# Patient Record
Sex: Male | Born: 1965 | Race: White | Hispanic: No | Marital: Married | Smoking: Former smoker
Health system: Southern US, Community
[De-identification: ages and names within clinical notes are randomized; demographics above are authoritative.]

## PROBLEM LIST (undated history)

## (undated) DIAGNOSIS — C259 Malignant neoplasm of pancreas, unspecified: Secondary | ICD-10-CM

## (undated) DIAGNOSIS — C787 Secondary malignant neoplasm of liver and intrahepatic bile duct: Secondary | ICD-10-CM

## (undated) DIAGNOSIS — E782 Mixed hyperlipidemia: Secondary | ICD-10-CM

## (undated) DIAGNOSIS — C250 Malignant neoplasm of head of pancreas: Secondary | ICD-10-CM

## (undated) DIAGNOSIS — Z8616 Personal history of COVID-19: Secondary | ICD-10-CM

## (undated) DIAGNOSIS — K219 Gastro-esophageal reflux disease without esophagitis: Secondary | ICD-10-CM

## (undated) DIAGNOSIS — K2270 Barrett's esophagus without dysplasia: Secondary | ICD-10-CM

## (undated) DIAGNOSIS — M542 Cervicalgia: Secondary | ICD-10-CM

## (undated) DIAGNOSIS — I10 Essential (primary) hypertension: Secondary | ICD-10-CM

## (undated) HISTORY — DX: Mixed hyperlipidemia: E78.2

## (undated) HISTORY — PX: NASAL POLYP SURGERY: SHX186

## (undated) HISTORY — DX: Barrett's esophagus without dysplasia: K22.70

## (undated) HISTORY — DX: Gastro-esophageal reflux disease without esophagitis: K21.9

## (undated) HISTORY — DX: Malignant neoplasm of pancreas, unspecified: C25.9

## (undated) HISTORY — DX: Essential (primary) hypertension: I10

## (undated) HISTORY — DX: Malignant neoplasm of head of pancreas: C25.0

## (undated) HISTORY — DX: Malignant neoplasm of pancreas, unspecified: C78.7

## (undated) HISTORY — DX: Personal history of COVID-19: Z86.16

## (undated) HISTORY — DX: Cervicalgia: M54.2

---

## 1986-05-05 HISTORY — PX: REFRACTIVE SURGERY: SHX103

## 1999-12-26 ENCOUNTER — Inpatient Hospital Stay (HOSPITAL_COMMUNITY): Admission: EM | Admit: 1999-12-26 | Discharge: 1999-12-28 | Payer: Self-pay | Admitting: Cardiology

## 2001-08-04 ENCOUNTER — Encounter: Payer: Self-pay | Admitting: Otolaryngology

## 2001-08-04 ENCOUNTER — Encounter: Admission: RE | Admit: 2001-08-04 | Discharge: 2001-08-04 | Payer: Self-pay | Admitting: Otolaryngology

## 2001-09-10 ENCOUNTER — Encounter: Payer: Self-pay | Admitting: Otolaryngology

## 2001-09-10 ENCOUNTER — Encounter: Admission: RE | Admit: 2001-09-10 | Discharge: 2001-09-10 | Payer: Self-pay | Admitting: Otolaryngology

## 2001-12-08 ENCOUNTER — Ambulatory Visit (HOSPITAL_BASED_OUTPATIENT_CLINIC_OR_DEPARTMENT_OTHER): Admission: RE | Admit: 2001-12-08 | Discharge: 2001-12-08 | Payer: Self-pay | Admitting: Otolaryngology

## 2004-08-26 ENCOUNTER — Ambulatory Visit: Payer: Self-pay | Admitting: Family Medicine

## 2014-09-04 DIAGNOSIS — N289 Disorder of kidney and ureter, unspecified: Secondary | ICD-10-CM | POA: Insufficient documentation

## 2015-01-05 HISTORY — PX: LAPAROSCOPIC CHOLECYSTECTOMY: SUR755

## 2015-09-04 ENCOUNTER — Other Ambulatory Visit: Payer: Self-pay | Admitting: Gastroenterology

## 2015-09-04 DIAGNOSIS — D136 Benign neoplasm of pancreas: Secondary | ICD-10-CM

## 2015-09-17 ENCOUNTER — Inpatient Hospital Stay: Admission: RE | Admit: 2015-09-17 | Payer: Self-pay | Source: Ambulatory Visit

## 2015-09-22 ENCOUNTER — Ambulatory Visit
Admission: RE | Admit: 2015-09-22 | Discharge: 2015-09-22 | Disposition: A | Discharge status: Home / Self Care | Payer: Managed Care, Other (non HMO) | Source: Ambulatory Visit | Attending: Gastroenterology | Admitting: Gastroenterology

## 2015-09-22 DIAGNOSIS — D136 Benign neoplasm of pancreas: Secondary | ICD-10-CM

## 2015-09-22 MED ORDER — GADOBENATE DIMEGLUMINE 529 MG/ML IV SOLN
17.0000 mL | Freq: Once | INTRAVENOUS | Status: AC | PRN
  Administered 2015-09-22: 17 mL via INTRAVENOUS

## 2015-10-15 DIAGNOSIS — K8689 Other specified diseases of pancreas: Secondary | ICD-10-CM | POA: Insufficient documentation

## 2015-10-15 DIAGNOSIS — C787 Secondary malignant neoplasm of liver and intrahepatic bile duct: Secondary | ICD-10-CM | POA: Insufficient documentation

## 2015-10-15 DIAGNOSIS — C259 Malignant neoplasm of pancreas, unspecified: Secondary | ICD-10-CM | POA: Insufficient documentation

## 2015-11-02 DIAGNOSIS — I10 Essential (primary) hypertension: Secondary | ICD-10-CM | POA: Insufficient documentation

## 2015-11-02 DIAGNOSIS — G4733 Obstructive sleep apnea (adult) (pediatric): Secondary | ICD-10-CM | POA: Insufficient documentation

## 2015-11-09 HISTORY — PX: WHIPPLE PROCEDURE: SHX2667

## 2016-11-06 DIAGNOSIS — R600 Localized edema: Secondary | ICD-10-CM | POA: Diagnosis not present

## 2016-11-06 DIAGNOSIS — I10 Essential (primary) hypertension: Secondary | ICD-10-CM | POA: Diagnosis not present

## 2016-11-06 DIAGNOSIS — K219 Gastro-esophageal reflux disease without esophagitis: Secondary | ICD-10-CM | POA: Diagnosis not present

## 2016-11-21 DIAGNOSIS — D701 Agranulocytosis secondary to cancer chemotherapy: Secondary | ICD-10-CM | POA: Diagnosis not present

## 2016-11-21 DIAGNOSIS — C772 Secondary and unspecified malignant neoplasm of intra-abdominal lymph nodes: Secondary | ICD-10-CM | POA: Diagnosis not present

## 2016-11-21 DIAGNOSIS — Z08 Encounter for follow-up examination after completed treatment for malignant neoplasm: Secondary | ICD-10-CM | POA: Diagnosis not present

## 2016-11-21 DIAGNOSIS — T451X5D Adverse effect of antineoplastic and immunosuppressive drugs, subsequent encounter: Secondary | ICD-10-CM | POA: Diagnosis not present

## 2016-11-21 DIAGNOSIS — C250 Malignant neoplasm of head of pancreas: Secondary | ICD-10-CM | POA: Diagnosis not present

## 2016-12-01 DIAGNOSIS — R51 Headache: Secondary | ICD-10-CM | POA: Diagnosis not present

## 2016-12-01 DIAGNOSIS — B349 Viral infection, unspecified: Secondary | ICD-10-CM | POA: Diagnosis not present

## 2016-12-01 DIAGNOSIS — R509 Fever, unspecified: Secondary | ICD-10-CM | POA: Diagnosis not present

## 2016-12-22 DIAGNOSIS — C250 Malignant neoplasm of head of pancreas: Secondary | ICD-10-CM | POA: Diagnosis not present

## 2016-12-22 DIAGNOSIS — R1013 Epigastric pain: Secondary | ICD-10-CM | POA: Diagnosis not present

## 2016-12-24 ENCOUNTER — Encounter: Payer: Self-pay | Admitting: Radiation Oncology

## 2016-12-24 DIAGNOSIS — C250 Malignant neoplasm of head of pancreas: Secondary | ICD-10-CM | POA: Diagnosis not present

## 2017-01-01 DIAGNOSIS — Z8507 Personal history of malignant neoplasm of pancreas: Secondary | ICD-10-CM | POA: Diagnosis not present

## 2017-01-01 DIAGNOSIS — G4733 Obstructive sleep apnea (adult) (pediatric): Secondary | ICD-10-CM | POA: Diagnosis not present

## 2017-01-01 DIAGNOSIS — R1013 Epigastric pain: Secondary | ICD-10-CM | POA: Diagnosis not present

## 2017-01-01 DIAGNOSIS — Z9049 Acquired absence of other specified parts of digestive tract: Secondary | ICD-10-CM | POA: Diagnosis not present

## 2017-01-01 DIAGNOSIS — I10 Essential (primary) hypertension: Secondary | ICD-10-CM | POA: Diagnosis not present

## 2017-02-06 DIAGNOSIS — Z08 Encounter for follow-up examination after completed treatment for malignant neoplasm: Secondary | ICD-10-CM | POA: Diagnosis not present

## 2017-02-06 DIAGNOSIS — C250 Malignant neoplasm of head of pancreas: Secondary | ICD-10-CM | POA: Diagnosis not present

## 2017-02-06 DIAGNOSIS — D72819 Decreased white blood cell count, unspecified: Secondary | ICD-10-CM | POA: Diagnosis not present

## 2017-03-17 DIAGNOSIS — J0100 Acute maxillary sinusitis, unspecified: Secondary | ICD-10-CM | POA: Diagnosis not present

## 2017-06-05 DIAGNOSIS — K2100 Gastro-esophageal reflux disease with esophagitis, without bleeding: Secondary | ICD-10-CM | POA: Insufficient documentation

## 2017-06-05 DIAGNOSIS — K219 Gastro-esophageal reflux disease without esophagitis: Secondary | ICD-10-CM | POA: Insufficient documentation

## 2017-09-17 ENCOUNTER — Encounter: Payer: Self-pay | Admitting: Gastroenterology

## 2017-11-23 DIAGNOSIS — M62838 Other muscle spasm: Secondary | ICD-10-CM | POA: Diagnosis not present

## 2017-12-16 DIAGNOSIS — R509 Fever, unspecified: Secondary | ICD-10-CM | POA: Diagnosis not present

## 2017-12-16 DIAGNOSIS — R109 Unspecified abdominal pain: Secondary | ICD-10-CM | POA: Diagnosis not present

## 2017-12-16 DIAGNOSIS — R079 Chest pain, unspecified: Secondary | ICD-10-CM | POA: Diagnosis not present

## 2017-12-16 DIAGNOSIS — Z90411 Acquired partial absence of pancreas: Secondary | ICD-10-CM | POA: Diagnosis not present

## 2017-12-16 DIAGNOSIS — R1012 Left upper quadrant pain: Secondary | ICD-10-CM | POA: Diagnosis not present

## 2017-12-18 DIAGNOSIS — K5909 Other constipation: Secondary | ICD-10-CM | POA: Diagnosis not present

## 2017-12-18 DIAGNOSIS — R509 Fever, unspecified: Secondary | ICD-10-CM | POA: Diagnosis not present

## 2018-01-29 DIAGNOSIS — R7989 Other specified abnormal findings of blood chemistry: Secondary | ICD-10-CM | POA: Diagnosis not present

## 2018-01-29 DIAGNOSIS — C250 Malignant neoplasm of head of pancreas: Secondary | ICD-10-CM | POA: Diagnosis not present

## 2018-01-29 DIAGNOSIS — C772 Secondary and unspecified malignant neoplasm of intra-abdominal lymph nodes: Secondary | ICD-10-CM | POA: Diagnosis not present

## 2018-02-02 DIAGNOSIS — K2970 Gastritis, unspecified, without bleeding: Secondary | ICD-10-CM | POA: Diagnosis not present

## 2018-02-02 DIAGNOSIS — K228 Other specified diseases of esophagus: Secondary | ICD-10-CM | POA: Diagnosis not present

## 2018-02-02 DIAGNOSIS — Z13810 Encounter for screening for upper gastrointestinal disorder: Secondary | ICD-10-CM | POA: Diagnosis not present

## 2018-11-02 ENCOUNTER — Other Ambulatory Visit: Payer: Self-pay | Admitting: Physician Assistant

## 2018-11-02 DIAGNOSIS — M542 Cervicalgia: Secondary | ICD-10-CM

## 2018-11-27 ENCOUNTER — Other Ambulatory Visit: Payer: Managed Care, Other (non HMO)

## 2018-12-20 ENCOUNTER — Other Ambulatory Visit: Payer: Managed Care, Other (non HMO)

## 2019-01-03 DIAGNOSIS — M5030 Other cervical disc degeneration, unspecified cervical region: Secondary | ICD-10-CM | POA: Insufficient documentation

## 2019-04-26 ENCOUNTER — Encounter: Payer: Self-pay | Admitting: Gastroenterology

## 2019-05-02 DIAGNOSIS — R0789 Other chest pain: Secondary | ICD-10-CM | POA: Diagnosis not present

## 2019-05-02 DIAGNOSIS — I10 Essential (primary) hypertension: Secondary | ICD-10-CM | POA: Diagnosis not present

## 2019-05-02 DIAGNOSIS — D72819 Decreased white blood cell count, unspecified: Secondary | ICD-10-CM | POA: Diagnosis not present

## 2019-05-02 DIAGNOSIS — U071 COVID-19: Secondary | ICD-10-CM | POA: Diagnosis not present

## 2019-05-03 DIAGNOSIS — I10 Essential (primary) hypertension: Secondary | ICD-10-CM | POA: Diagnosis not present

## 2019-05-03 DIAGNOSIS — R0789 Other chest pain: Secondary | ICD-10-CM | POA: Diagnosis not present

## 2019-05-03 DIAGNOSIS — D72819 Decreased white blood cell count, unspecified: Secondary | ICD-10-CM | POA: Diagnosis not present

## 2019-05-03 DIAGNOSIS — U071 COVID-19: Secondary | ICD-10-CM | POA: Diagnosis not present

## 2019-07-09 ENCOUNTER — Other Ambulatory Visit: Payer: Self-pay | Admitting: Family Medicine

## 2019-08-19 ENCOUNTER — Other Ambulatory Visit: Payer: Self-pay | Admitting: Hematology and Oncology

## 2019-08-19 DIAGNOSIS — K7689 Other specified diseases of liver: Secondary | ICD-10-CM

## 2019-09-15 ENCOUNTER — Ambulatory Visit
Admission: RE | Admit: 2019-09-15 | Discharge: 2019-09-15 | Disposition: A | Discharge status: Home / Self Care | Payer: Managed Care, Other (non HMO) | Source: Ambulatory Visit | Attending: Hematology and Oncology | Admitting: Hematology and Oncology

## 2019-09-15 DIAGNOSIS — K7689 Other specified diseases of liver: Secondary | ICD-10-CM

## 2019-09-15 MED ORDER — GADOBENATE DIMEGLUMINE 529 MG/ML IV SOLN
20.0000 mL | Freq: Once | INTRAVENOUS | Status: AC | PRN
  Administered 2019-09-15: 20 mL via INTRAVENOUS

## 2019-10-07 DIAGNOSIS — K769 Liver disease, unspecified: Secondary | ICD-10-CM | POA: Insufficient documentation

## 2019-10-13 ENCOUNTER — Other Ambulatory Visit: Payer: Self-pay | Admitting: Family Medicine

## 2019-10-13 ENCOUNTER — Other Ambulatory Visit: Payer: Self-pay

## 2019-10-13 ENCOUNTER — Encounter: Payer: Self-pay | Admitting: Family Medicine

## 2019-10-13 ENCOUNTER — Ambulatory Visit: Payer: Managed Care, Other (non HMO) | Admitting: Family Medicine

## 2019-10-13 VITALS — BP 132/88 | HR 66 | Temp 97.4°F | Ht 70.0 in | Wt 223.0 lb

## 2019-10-13 DIAGNOSIS — C787 Secondary malignant neoplasm of liver and intrahepatic bile duct: Secondary | ICD-10-CM | POA: Insufficient documentation

## 2019-10-13 DIAGNOSIS — C259 Malignant neoplasm of pancreas, unspecified: Secondary | ICD-10-CM | POA: Diagnosis not present

## 2019-10-13 DIAGNOSIS — K5904 Chronic idiopathic constipation: Secondary | ICD-10-CM

## 2019-10-13 DIAGNOSIS — Z8616 Personal history of COVID-19: Secondary | ICD-10-CM

## 2019-10-13 DIAGNOSIS — K8689 Other specified diseases of pancreas: Secondary | ICD-10-CM

## 2019-10-13 DIAGNOSIS — K219 Gastro-esophageal reflux disease without esophagitis: Secondary | ICD-10-CM | POA: Diagnosis not present

## 2019-10-13 DIAGNOSIS — I10 Essential (primary) hypertension: Secondary | ICD-10-CM | POA: Insufficient documentation

## 2019-10-13 MED ORDER — TRAZODONE HCL 50 MG PO TABS: 25.0000 mg | ORAL_TABLET | Freq: Every evening | ORAL | 3 refills | Status: DC | PRN

## 2019-10-13 NOTE — Progress Notes (Signed)
Subjective:  [name], male    DOB: 18-Dec-1965  Age: 54 y.o. MRN: [id number]  Chief Complaint  Patient presents with  . Pancreatic Cancer    Metastasized to liver  . Anxiety    HPI Patient is a 54 year old white male with pancreatic cancer diagnosed approximately 4 years ago.  He underwent treatment at that time which included a Whipple procedure.  He is continue to have routine monitoring and recently was found to have liver metastases, lungs, around aorta.  This makes it stage IV pancreatic cancer.  He is planning on continuing recommended treatments by his oncologist which will likely be Immunotherapy. Marland Kitchen  He denies any abdominal pain, nausea, vomiting.  He has had significant weight loss since his diagnosis of cancer however he has seemed to stabilize.  He is currently on Creon taking 3 capsules with each meal 1 before and 2 after.  He takes 2 capsules with snacks.  Hypertension: Currently being treated with amlodipine 5 mg twice daily and Zestril 10 mg once daily.  His blood pressure is well controlled.  Patient has generalized anxiety disorder and takes clonazepam 1 mg as needed daily for anxiety.  He also has trazodone for insomnia.   Office Visit from 10/13/2019 in [place]  PHQ-9 Total Score 7     PHQ9 SCORE ONLY 10/13/2019  PHQ-9 Total Score 7   Patient has problems with chronic constipation is currently taking MiraLAX and Linzess 72 mcg daily.  GERD-reflux is well controlled with Nexium.  Current Outpatient Medications on File Prior to Visit  Medication Sig Dispense Refill  . linaclotide (LINZESS) 72 MCG capsule as needed.    Marland Kitchen amLODipine (NORVASC) 5 MG tablet Take 5 mg by mouth 2 (two) times daily.    Marland Kitchen CREON 36000-114000 units CPEP capsule TAKE 3 capsules with meals. 1 after starting/2 when finished eating. Take 2 capsules with snack after starting to eat.    . esomeprazole (NEXIUM) 40 MG capsule Take 40 mg by mouth daily.    Marland Kitchen guaifenesin  (HUMIBID E) 400 MG TABS tablet Take by mouth as needed.    . meloxicam (MOBIC) 7.5 MG tablet Take 7.5 mg by mouth 2 (two) times daily.    . Multiple Vitamin (MULTIVITAMIN) capsule Take 1 capsule by mouth daily.    . Omega-3 1000 MG CAPS Take by mouth.    . polyethylene glycol powder (GLYCOLAX/MIRALAX) 17 GM/SCOOP powder Take by mouth.     No current facility-administered medications on file prior to visit.   Past Medical History:  Diagnosis Date  . Barrett's esophagus   . Cervicalgia   . Essential hypertension   . GERD (gastroesophageal reflux disease)   . History of COVID-19    05/2019  . Mixed hyperlipidemia   . Pancreatic carcinoma metastatic to liver ([place])   . Primary malignant neoplasm of head of pancreas ([facility])     Family History  Problem Relation Age of Onset  . Cancer Mother        breast  . Hypertension Father   . Hyperlipidemia Father   . Cancer Father        prostate   Social History   Socioeconomic History  . Marital status: Married    Spouse name: Not on file  . Number of children: 2  . Years of education: Not on file  . Highest education level: Not on file  Occupational History  . Occupation: Retired    Comment: [employer]  Tobacco  Use  . Smoking status: Former Smoker    Types: Cigars    Quit date: 2005    Years since quitting: 16.4  . Smokeless tobacco: Never Used  Substance and Sexual Activity  . Alcohol use: Not Currently  . Drug use: Never  . Sexual activity: Not on file  Other Topics Concern  . Not on file  Social History Narrative  . Not on file   Social Determinants of Health   Financial Resource Strain:   . Difficulty of Paying Living Expenses:   Food Insecurity:   . Worried About Charity fundraiser in the Last Year:   . Arboriculturist in the Last Year:   Transportation Needs:   . Film/video editor (Medical):   Marland Kitchen Lack of Transportation (Non-Medical):   Physical Activity:   . Days of Exercise per Week:   . Minutes of  Exercise per Session:   Stress:   . Feeling of Stress :   Social Connections:   . Frequency of Communication with Friends and Family:   . Frequency of Social Gatherings with Friends and Family:   . Attends Religious Services:   . Active Member of Clubs or Organizations:   . Attends Archivist Meetings:   Marland Kitchen Marital Status:     Review of Systems  Constitutional: Positive for fatigue. Negative for chills and fever.  HENT: Negative for congestion, ear pain and sore throat.   Respiratory: Negative for cough and shortness of breath.   Cardiovascular: Negative for chest pain.  Gastrointestinal: Negative for abdominal pain, constipation, diarrhea, nausea and vomiting.       Occasional pressure over liver and epigastric tenderness.  Endocrine: Negative for polydipsia, polyphagia and polyuria.  Genitourinary: Negative for dysuria and frequency.  Musculoskeletal: Negative for arthralgias and myalgias.  Neurological: Negative for dizziness and headaches.  Psychiatric/Behavioral: Negative for dysphoric mood.       No dysphoria     Objective:  BP 132/88   Pulse 66   Temp (!) 97.4 F (36.3 C)   Ht 5\' 10"  (1.778 m)   Wt 223 lb (101.2 kg)   SpO2 98%   BMI 32.00 kg/m   BP/Weight 10/13/2019 7/67/3419  Systolic BP 379 -  Diastolic BP 88 -  Wt. (Lbs) 223 185  BMI 32 -    Physical Exam Vitals reviewed.  Constitutional:      Appearance: Normal appearance.  Neck:     Vascular: No carotid bruit.  Cardiovascular:     Rate and Rhythm: Normal rate and regular rhythm.     Pulses: Normal pulses.     Heart sounds: Normal heart sounds.  Pulmonary:     Effort: Pulmonary effort is normal.     Breath sounds: Normal breath sounds. No wheezing, rhonchi or rales.  Abdominal:     General: Bowel sounds are normal.     Palpations: Abdomen is soft.     Tenderness: There is no abdominal tenderness.  Neurological:     Mental Status: He is alert.  Psychiatric:        Mood and Affect:  Mood normal.        Behavior: Behavior normal.     Diabetic Foot Exam - Simple   No data filed       Lab Results  Component Value Date   CHOL 199 10/13/2019   TRIG 104 10/13/2019   HDL 54 10/13/2019   LDLCALC 126 (H) 10/13/2019      Assessment &  Plan:   1. Essential hypertension, benign Well controlled.  No changes to medicines.  Continue to work on eating a healthy diet and exercise.  Labs drawn today.  - Lipid panel  2. Personal history of covid-19 - SAR CoV2 Serology (COVID 19)AB(IGG)IA - information to get covid vaccine.   3. Pancreatic cancer metastasized to liver [facility]) Follow up with oncology to start immunotherapy.  4. GERD without esophagitis The current medical regimen is effective;  continue present plan and medications.  5. Chronic idiopathic constipation The current medical regimen is effective;  continue present plan and medications.  6. Pancreatic insufficiency  The current medical regimen is effective;  continue present plan and medications.  7. GAD The current medical regimen is effective;  continue present plan and medications.  Meds ordered this encounter  Medications  . traZODone (DESYREL) 50 MG tablet    Sig: Take 0.5-1 tablets (25-50 mg total) by mouth at bedtime as needed for sleep.    Dispense:  30 tablet    Refill:  3    Orders Placed This Encounter  Procedures  . SAR CoV2 Serology (COVID 19)AB(IGG)IA  . Lipid panel  . Cardiovascular Risk Assessment     Follow-up: Return in about 3 months (around 01/13/2020).  An After Visit Summary was printed and given to the patient.  Rochel Brome [facility] [phone]

## 2019-10-13 NOTE — Progress Notes (Signed)
Note deleted

## 2019-10-13 NOTE — Patient Instructions (Addendum)
COVID-19 Vaccine Information can be found at: [email or web address] For questions related to vaccine distribution or appointments, please email [email or web address] or call [phone].    

## 2019-10-14 ENCOUNTER — Other Ambulatory Visit: Payer: Self-pay | Admitting: Family Medicine

## 2019-10-14 LAB — LIPID PANEL
Chol/HDL Ratio: 3.7 ratio (ref 0.0–5.0)
Cholesterol, Total: 199 mg/dL (ref 100–199)
HDL: 54 mg/dL (ref >39)
LDL Chol Calc (NIH): 126 mg/dL — ABNORMAL HIGH (ref 0–99)
Triglycerides: 104 mg/dL (ref 0–149)
VLDL Cholesterol Cal: 19 mg/dL (ref 5–40)

## 2019-10-14 LAB — CARDIOVASCULAR RISK ASSESSMENT

## 2019-10-14 LAB — SAR COV2 SEROLOGY (COVID19)AB(IGG),IA: DiaSorin SARS-CoV-2 Ab, IgG: POSITIVE

## 2019-10-16 ENCOUNTER — Encounter: Payer: Self-pay | Admitting: Family Medicine

## 2019-10-16 DIAGNOSIS — K8689 Other specified diseases of pancreas: Secondary | ICD-10-CM | POA: Insufficient documentation

## 2019-10-16 DIAGNOSIS — K219 Gastro-esophageal reflux disease without esophagitis: Secondary | ICD-10-CM | POA: Insufficient documentation

## 2019-10-16 DIAGNOSIS — K5904 Chronic idiopathic constipation: Secondary | ICD-10-CM | POA: Insufficient documentation

## 2019-10-27 LAB — SPECIMEN STATUS REPORT

## 2019-10-27 LAB — SARS-COV-2 SEMI-QUANTITATIVE TOTAL ANTIBODY, SPIKE: SARS-CoV-2 Semi-Quant Total Ab: 552.9 U/mL (ref <0.8)

## 2019-11-21 ENCOUNTER — Other Ambulatory Visit: Payer: Self-pay | Admitting: Family Medicine

## 2020-01-17 ENCOUNTER — Ambulatory Visit: Payer: Managed Care, Other (non HMO) | Admitting: Family Medicine

## 2020-01-18 ENCOUNTER — Ambulatory Visit (INDEPENDENT_AMBULATORY_CARE_PROVIDER_SITE_OTHER): Payer: Managed Care, Other (non HMO) | Admitting: Family Medicine

## 2020-01-18 ENCOUNTER — Encounter: Payer: Self-pay | Admitting: Family Medicine

## 2020-01-18 ENCOUNTER — Other Ambulatory Visit: Payer: Self-pay

## 2020-01-18 VITALS — BP 124/82 | HR 73 | Temp 97.4°F | Ht 69.0 in | Wt 209.0 lb

## 2020-01-18 DIAGNOSIS — C787 Secondary malignant neoplasm of liver and intrahepatic bile duct: Secondary | ICD-10-CM | POA: Diagnosis not present

## 2020-01-18 DIAGNOSIS — M10472 Other secondary gout, left ankle and foot: Secondary | ICD-10-CM | POA: Diagnosis not present

## 2020-01-18 DIAGNOSIS — C259 Malignant neoplasm of pancreas, unspecified: Secondary | ICD-10-CM | POA: Diagnosis not present

## 2020-01-18 MED ORDER — ALLOPURINOL 100 MG PO TABS: 100.0000 mg | ORAL_TABLET | Freq: Every day | ORAL | 2 refills | Status: DC

## 2020-01-18 MED ORDER — COLCHICINE 0.6 MG PO TABS: 0.6000 mg | ORAL_TABLET | Freq: Every day | ORAL | 0 refills | Status: DC

## 2020-01-18 NOTE — Patient Instructions (Signed)
Colchicine 0.6 mg one twice a day x 1 week. Start on allopurinol 100 mg in one week (after flare)

## 2020-01-18 NOTE — Progress Notes (Signed)
j   Acute Office Visit  Subjective:    [name], male    DOB: 1965-09-17, 54 y.o.   MRN: [id number]  Chief Complaint  Patient presents with  . Toe Pain    HPI Patient is in today for pain in left big toe x 1 day. Redness and warm to the touch. Patient states that a hot shower made it feel better this a.m., has had gout in the past. Patient has had 4 chemo treatments in the last 2 months.   Stage 4 Pancreatic cancer: Being seen at [facility].  Status post Whipple.  Now getting chemotherapy.   Past Medical History:  Diagnosis Date  . Barrett's esophagus   . Cervicalgia   . Essential hypertension   . GERD (gastroesophageal reflux disease)   . History of COVID-19    05/2019  . Mixed hyperlipidemia   . Pancreatic carcinoma metastatic to liver ([place])   . Primary malignant neoplasm of head of pancreas [facility])     Past Surgical History:  Procedure Laterality Date  . LAPAROSCOPIC CHOLECYSTECTOMY  01/05/2015  . NASAL POLYP SURGERY    . REFRACTIVE SURGERY Bilateral 1988  . WHIPPLE PROCEDURE  11/09/2015   Pylorus sparring    Family History  Problem Relation Age of Onset  . Cancer Mother        breast  . Hypertension Father   . Hyperlipidemia Father   . Cancer Father        prostate    Social History   Socioeconomic History  . Marital status: Married    Spouse name: Not on file  . Number of children: 2  . Years of education: Not on file  . Highest education level: Not on file  Occupational History  . Occupation: Retired    Comment: [employer]  Tobacco Use  . Smoking status: Former Smoker    Types: Cigars    Quit date: 2005    Years since quitting: 16.7  . Smokeless tobacco: Never Used  Substance and Sexual Activity  . Alcohol use: Not Currently  . Drug use: Never  . Sexual activity: Not on file  Other Topics Concern  . Not on file  Social History Narrative  . Not on file   Social Determinants of Health   Financial Resource Strain:   .  Difficulty of Paying Living Expenses: Not on file  Food Insecurity:   . Worried About Charity fundraiser in the Last Year: Not on file  . Ran Out of Food in the Last Year: Not on file  Transportation Needs:   . Lack of Transportation (Medical): Not on file  . Lack of Transportation (Non-Medical): Not on file  Physical Activity:   . Days of Exercise per Week: Not on file  . Minutes of Exercise per Session: Not on file  Stress:   . Feeling of Stress : Not on file  Social Connections:   . Frequency of Communication with Friends and Family: Not on file  . Frequency of Social Gatherings with Friends and Family: Not on file  . Attends Religious Services: Not on file  . Active Member of Clubs or Organizations: Not on file  . Attends Archivist Meetings: Not on file  . Marital Status: Not on file  Intimate Partner Violence:   . Fear of Current or Ex-Partner: Not on file  . Emotionally Abused: Not on file  . Physically Abused: Not on file  . Sexually Abused: Not on file  Outpatient Medications Prior to Visit  Medication Sig Dispense Refill  . Cyanocobalamin (VITAMIN B-12 PO) Take by mouth.    . Pyridoxine HCl (VITAMIN B-6 PO) Take by mouth.    . Riboflavin (VITAMIN B-2 PO) Take by mouth.    . Thiamine HCl (VITAMIN B-1 PO) Take by mouth.    Marland Kitchen amLODipine (NORVASC) 5 MG tablet TAKE ONE TABLET BY MOUTH TWICE DAILY 180 tablet 1  . clonazePAM (KLONOPIN) 1 MG tablet TAKE ONE TABLET BY MOUTH EVERY DAY AT BEDTIME AS NEEDED 30 tablet 2  . CREON 36000-114000 units CPEP capsule TAKE 3 capsules with meals. 1 after starting/2 when finished eating. Take 2 capsules with snack after starting to eat.    . esomeprazole (NEXIUM) 40 MG capsule Take 40 mg by mouth daily.    Marland Kitchen guaifenesin (HUMIBID E) 400 MG TABS tablet Take by mouth as needed.    . linaclotide (LINZESS) 72 MCG capsule as needed.    Marland Kitchen lisinopril (ZESTRIL) 10 MG tablet Take 1 tablet by mouth once daily 90 tablet 1  . LORazepam  (ATIVAN) 0.5 MG tablet Take 0.5 mg by mouth every 6 (six) hours as needed.    . meloxicam (MOBIC) 7.5 MG tablet Take 7.5 mg by mouth 2 (two) times daily.    . Multiple Vitamin (MULTIVITAMIN) capsule Take 1 capsule by mouth daily.    . Omega-3 1000 MG CAPS Take by mouth.    . polyethylene glycol powder (GLYCOLAX/MIRALAX) 17 GM/SCOOP powder Take by mouth.    . traZODone (DESYREL) 50 MG tablet Take 0.5-1 tablets (25-50 mg total) by mouth at bedtime as needed for sleep. 30 tablet 3   No facility-administered medications prior to visit.    No Known Allergies  Review of Systems  Constitutional: Positive for fatigue and unexpected weight change. Negative for chills, diaphoresis and fever.  HENT: Positive for ear pain. Negative for congestion and sore throat.   Eyes: Positive for visual disturbance.  Respiratory: Negative for cough and shortness of breath.   Cardiovascular: Negative for chest pain and leg swelling.  Gastrointestinal: Positive for diarrhea, nausea and vomiting. Negative for abdominal pain and constipation.  Endocrine: Positive for polydipsia. Negative for polyphagia.  Genitourinary: Negative for dysuria and urgency.  Musculoskeletal: Positive for arthralgias and back pain. Negative for myalgias.  Neurological: Positive for headaches. Negative for dizziness.  Psychiatric/Behavioral: Negative for dysphoric mood.       Objective:    Physical Exam Vitals reviewed.  Constitutional:      Appearance: Normal appearance.  Musculoskeletal:        General: Tenderness (left great toe. Warmth. Redness.) present.  Neurological:     Mental Status: He is alert.     BP 124/82   Pulse 73   Temp (!) 97.4 F (36.3 C)   Ht 5' 9"  (1.753 m)   Wt 209 lb (94.8 kg)   SpO2 94%   BMI 30.86 kg/m  Wt Readings from Last 3 Encounters:  01/18/20 209 lb (94.8 kg)  10/13/19 223 lb (101.2 kg)  09/22/15 185 lb (83.9 kg)    Health Maintenance Due  Topic Date Due  . Hepatitis C Screening   Never done  . COVID-19 Vaccine (1) Never done  . HIV Screening  Never done  . TETANUS/TDAP  Never done  . COLONOSCOPY  Never done  . INFLUENZA VACCINE  Never done    There are no preventive care reminders to display for this patient.   No results found for: TSH  No results found for: WBC, HGB, HCT, MCV, PLT No results found for: NA, K, CHLORIDE, CO2, GLUCOSE, BUN, CREATININE, BILITOT, ALKPHOS, AST, ALT, PROT, ALBUMIN, CALCIUM, ANIONGAP, EGFR, GFR Lab Results  Component Value Date   CHOL 199 10/13/2019   Lab Results  Component Value Date   HDL 54 10/13/2019   Lab Results  Component Value Date   LDLCALC 126 (H) 10/13/2019   Lab Results  Component Value Date   TRIG 104 10/13/2019   Lab Results  Component Value Date   CHOLHDL 3.7 10/13/2019   No results found for: HGBA1C     Assessment & Plan:  1. Acute gout due to other secondary cause involving toe of left foot Discussed with patient that with Chemo treatments he is more susceptible to gout. - colchicine 0.6 MG tablet; Take 1 tablet (0.6 mg total) by mouth daily.  Dispense: 14 tablet; Refill: 0 - allopurinol (ZYLOPRIM) 100 MG tablet; Take 1 tablet (100 mg total) by mouth daily.  Dispense: 30 tablet; Refill: 2   2. Pancreatic cancer metastasized to liver [facility]) Continue mgmt by oncology.  Follow-up: No follow-ups on file.  An After Visit Summary was printed and given to the patient.  Rochel Brome [facility] [phone]

## 2020-01-22 ENCOUNTER — Encounter: Payer: Self-pay | Admitting: Family Medicine

## 2020-02-03 ENCOUNTER — Other Ambulatory Visit: Payer: Self-pay

## 2020-02-03 MED ORDER — CLONAZEPAM 1 MG PO TABS: 1.0000 mg | ORAL_TABLET | Freq: Every evening | ORAL | 2 refills | Status: DC | PRN

## 2020-02-14 ENCOUNTER — Encounter: Payer: Self-pay | Admitting: Family Medicine

## 2020-02-14 ENCOUNTER — Other Ambulatory Visit: Payer: Self-pay

## 2020-02-14 ENCOUNTER — Ambulatory Visit (INDEPENDENT_AMBULATORY_CARE_PROVIDER_SITE_OTHER): Payer: Managed Care, Other (non HMO) | Admitting: Family Medicine

## 2020-02-14 VITALS — BP 122/82 | HR 70 | Temp 97.4°F | Ht 71.0 in | Wt 212.0 lb

## 2020-02-14 DIAGNOSIS — M1A4720 Other secondary chronic gout, left ankle and foot, without tophus (tophi): Secondary | ICD-10-CM

## 2020-02-14 DIAGNOSIS — Z0001 Encounter for general adult medical examination with abnormal findings: Secondary | ICD-10-CM

## 2020-02-14 DIAGNOSIS — Z125 Encounter for screening for malignant neoplasm of prostate: Secondary | ICD-10-CM

## 2020-02-14 MED ORDER — ALLOPURINOL 300 MG PO TABS: 300.0000 mg | ORAL_TABLET | Freq: Every day | ORAL | 6 refills | Status: DC

## 2020-02-14 MED ORDER — COLCHICINE 0.6 MG PO TABS: 0.6000 mg | ORAL_TABLET | Freq: Every day | ORAL | 1 refills | Status: DC

## 2020-02-14 NOTE — Patient Instructions (Signed)
Gout: Start colchicine 0.6 once daily.  Increase allopurinol 300 mg once daily     Preventive Care 33-56 Years Old, Male Preventive care refers to lifestyle choices and visits with your health care provider that can promote health and wellness. This includes:  A yearly physical exam. This is also called an annual well check.  Regular dental and eye exams.  Immunizations.  Screening for certain conditions.  Healthy lifestyle choices, such as eating a healthy diet, getting regular exercise, not using drugs or products that contain nicotine and tobacco, and limiting alcohol use. What can I expect for my preventive care visit? Physical exam Your health care provider will check:  Height and weight. These may be used to calculate body mass index (BMI), which is a measurement that tells if you are at a healthy weight.  Heart rate and blood pressure.  Your skin for abnormal spots. Counseling Your health care provider may ask you questions about:  Alcohol, tobacco, and drug use.  Emotional well-being.  Home and relationship well-being.  Sexual activity.  Eating habits.  Work and work Statistician. What immunizations do I need?  Influenza (flu) vaccine  This is recommended every year. Tetanus, diphtheria, and pertussis (Tdap) vaccine  You may need a Td booster every 10 years. Varicella (chickenpox) vaccine  You may need this vaccine if you have not already been vaccinated. Zoster (shingles) vaccine  You may need this after age 17. Measles, mumps, and rubella (MMR) vaccine  You may need at least one dose of MMR if you were born in 1957 or later. You may also need a second dose. Pneumococcal conjugate (PCV13) vaccine  You may need this if you have certain conditions and were not previously vaccinated. Pneumococcal polysaccharide (PPSV23) vaccine  You may need one or two doses if you smoke cigarettes or if you have certain conditions. Meningococcal conjugate (MenACWY)  vaccine  You may need this if you have certain conditions. Hepatitis A vaccine  You may need this if you have certain conditions or if you travel or work in places where you may be exposed to hepatitis A. Hepatitis B vaccine  You may need this if you have certain conditions or if you travel or work in places where you may be exposed to hepatitis B. Haemophilus influenzae type b (Hib) vaccine  You may need this if you have certain risk factors. Human papillomavirus (HPV) vaccine  If recommended by your health care provider, you may need three doses over 6 months. You may receive vaccines as individual doses or as more than one vaccine together in one shot (combination vaccines). Talk with your health care provider about the risks and benefits of combination vaccines. What tests do I need? Blood tests  Lipid and cholesterol levels. These may be checked every 5 years, or more frequently if you are over 69 years old.  Hepatitis C test.  Hepatitis B test. Screening  Lung cancer screening. You may have this screening every year starting at age 15 if you have a 30-pack-year history of smoking and currently smoke or have quit within the past 15 years.  Prostate cancer screening. Recommendations will vary depending on your family history and other risks.  Colorectal cancer screening. All adults should have this screening starting at age 50 and continuing until age 54. Your health care provider may recommend screening at age 26 if you are at increased risk. You will have tests every 1-10 years, depending on your results and the type of screening test.  Diabetes screening. This is done by checking your blood sugar (glucose) after you have not eaten for a while (fasting). You may have this done every 1-3 years.  Sexually transmitted disease (STD) testing. Follow these instructions at home: Eating and drinking  Eat a diet that includes fresh fruits and vegetables, whole grains, lean protein,  and low-fat dairy products.  Take vitamin and mineral supplements as recommended by your health care provider.  Do not drink alcohol if your health care provider tells you not to drink.  If you drink alcohol: ? Limit how much you have to 0-2 drinks a day. ? Be aware of how much alcohol is in your drink. In the [place] one drink equals one 12 oz bottle of beer (355 mL), one 5 oz glass of wine (148 mL), or one 1 oz glass of hard liquor (44 mL). Lifestyle  Take daily care of your teeth and gums.  Stay active. Exercise for at least 30 minutes on 5 or more days each week.  Do not use any products that contain nicotine or tobacco, such as cigarettes, e-cigarettes, and chewing tobacco. If you need help quitting, ask your health care provider.  If you are sexually active, practice safe sex. Use a condom or other form of protection to prevent STIs (sexually transmitted infections).  Talk with your health care provider about taking a low-dose aspirin every day starting at age 71. What's next?  Go to your health care provider once a year for a well check visit.  Ask your health care provider how often you should have your eyes and teeth checked.  Stay up to date on all vaccines. This information is not intended to replace advice given to you by your health care provider. Make sure you discuss any questions you have with your health care provider. Document Revised: 04/15/2018 Document Reviewed: 04/15/2018 Elsevier Patient Education  2020 [employer].

## 2020-02-14 NOTE — Progress Notes (Signed)
Subjective:  [name], male    DOB: 03/19/1966  Age: 54 y.o. MRN: [id number]  Chief Complaint  Patient presents with  . Annual Exam    HPI  Well Adult Physical: Patient here for a comprehensive physical exam.The patient reports problems - gout Do you take any herbs or supplements that were not prescribed by a doctor? yes Are you taking calcium supplements? no Are you taking aspirin daily? no  Encounter for general adult medical examination without abnormal findings  Physical ("At Risk" items are starred): Patient's last physical exam was 1 year ago .  Smoking:Former smoker ;  Physical Activity: Exercises at least 3 times per week ;  Alcohol/Drug Use: Is a non-drinker ; No illicit drug use ;  Patient is not afflicted from Stress Incontinence and Urge Incontinence  Safety: reviewed ; Patient wears a seat belt, has smoke detectors, has carbon monoxide detectors, practices appropriate gun safety, and wears sunscreen with extended sun exposure. Dental Care: cleanings as needed, brushes and flosses daily. Ophthalmology/Optometry: Annual visit. Due for a visit however advised that he wait until he finishes his chemo treatments.  Hearing loss: none Vision impairments: wears glasses Last PSA: 1.9 on 02/09/2019     Office Visit from 10/13/2019 in [place]  PHQ-2 Total Score 2      Social History   Socioeconomic History  . Marital status: Married    Spouse name: Not on file  . Number of children: 2  . Years of education: Not on file  . Highest education level: Not on file  Occupational History  . Occupation: Retired    Comment: [employer]  Tobacco Use  . Smoking status: Former Smoker    Types: Cigars    Quit date: 2005    Years since quitting: 16.8  . Smokeless tobacco: Never Used  Substance and Sexual Activity  . Alcohol use: Not Currently  . Drug use: Never  . Sexual activity: Yes    Partners: Female  Other Topics Concern  . Not on file    Social History Narrative  . Not on file   Social Determinants of Health   Financial Resource Strain:   . Difficulty of Paying Living Expenses: Not on file  Food Insecurity:   . Worried About Charity fundraiser in the Last Year: Not on file  . Ran Out of Food in the Last Year: Not on file  Transportation Needs:   . Lack of Transportation (Medical): Not on file  . Lack of Transportation (Non-Medical): Not on file  Physical Activity:   . Days of Exercise per Week: Not on file  . Minutes of Exercise per Session: Not on file  Stress:   . Feeling of Stress : Not on file  Social Connections:   . Frequency of Communication with Friends and Family: Not on file  . Frequency of Social Gatherings with Friends and Family: Not on file  . Attends Religious Services: Not on file  . Active Member of Clubs or Organizations: Not on file  . Attends Archivist Meetings: Not on file  . Marital Status: Not on file   Past Medical History:  Diagnosis Date  . Barrett's esophagus   . Cervicalgia   . Essential hypertension   . GERD (gastroesophageal reflux disease)   . History of COVID-19    05/2019  . Mixed hyperlipidemia   . Pancreatic carcinoma metastatic to liver ([place])   . Primary malignant neoplasm of head of  pancreas [facility])    Past Surgical History:  Procedure Laterality Date  . LAPAROSCOPIC CHOLECYSTECTOMY  01/05/2015  . NASAL POLYP SURGERY    . REFRACTIVE SURGERY Bilateral 1988  . WHIPPLE PROCEDURE  11/09/2015   Pylorus sparring    Family History  Problem Relation Age of Onset  . Cancer Mother        breast  . Hypertension Father   . Hyperlipidemia Father   . Cancer Father        prostate  . Hypertension Sister    Social History   Socioeconomic History  . Marital status: Married    Spouse name: Not on file  . Number of children: 2  . Years of education: Not on file  . Highest education level: Not on file  Occupational History  . Occupation: Retired     Comment: [employer]  Tobacco Use  . Smoking status: Former Smoker    Types: Cigars    Quit date: 2005    Years since quitting: 16.8  . Smokeless tobacco: Never Used  Substance and Sexual Activity  . Alcohol use: Not Currently  . Drug use: Never  . Sexual activity: Yes    Partners: Female  Other Topics Concern  . Not on file  Social History Narrative  . Not on file   Social Determinants of Health   Financial Resource Strain:   . Difficulty of Paying Living Expenses: Not on file  Food Insecurity:   . Worried About Charity fundraiser in the Last Year: Not on file  . Ran Out of Food in the Last Year: Not on file  Transportation Needs:   . Lack of Transportation (Medical): Not on file  . Lack of Transportation (Non-Medical): Not on file  Physical Activity:   . Days of Exercise per Week: Not on file  . Minutes of Exercise per Session: Not on file  Stress:   . Feeling of Stress : Not on file  Social Connections:   . Frequency of Communication with Friends and Family: Not on file  . Frequency of Social Gatherings with Friends and Family: Not on file  . Attends Religious Services: Not on file  . Active Member of Clubs or Organizations: Not on file  . Attends Archivist Meetings: Not on file  . Marital Status: Not on file   Review of Systems  Constitutional: Positive for diaphoresis (with chemo) and fatigue (with chemo). Negative for chills and fever.  HENT: Negative for congestion, ear pain and sore throat.   Respiratory: Negative for cough and shortness of breath.   Cardiovascular: Negative for chest pain.  Gastrointestinal: Negative for abdominal pain, constipation, diarrhea, nausea and vomiting.  Endocrine: Negative for polydipsia, polyphagia and polyuria.  Genitourinary: Negative for dysuria and frequency.  Musculoskeletal: Negative for arthralgias and myalgias.  Neurological: Negative for dizziness and headaches.  Psychiatric/Behavioral: Negative for  dysphoric mood.       No dysphoria    Objective:  BP 122/82   Pulse 70   Temp (!) 97.4 F (36.3 C)   Ht 5\' 11"  (1.803 m)   Wt 212 lb (96.2 kg)   SpO2 99%   BMI 29.57 kg/m   BP/Weight 02/14/2020 01/18/2020 8/76/8115  Systolic BP 726 203 559  Diastolic BP 82 82 88  Wt. (Lbs) 212 209 223  BMI 29.57 30.86 32    Physical Exam Vitals reviewed.  Constitutional:      Appearance: Normal appearance. He is normal weight.  HENT:  Right Ear: Tympanic membrane, ear canal and external ear normal.     Left Ear: Tympanic membrane, ear canal and external ear normal.     Nose: Nose normal. No congestion or rhinorrhea.     Mouth/Throat:     Mouth: Mucous membranes are moist.     Pharynx: No oropharyngeal exudate or posterior oropharyngeal erythema.  Eyes:     Conjunctiva/sclera: Conjunctivae normal.  Neck:     Vascular: No carotid bruit.  Cardiovascular:     Rate and Rhythm: Normal rate and regular rhythm.     Pulses: Normal pulses.     Heart sounds: Normal heart sounds.  Pulmonary:     Effort: Pulmonary effort is normal. No respiratory distress.     Breath sounds: Normal breath sounds. No wheezing, rhonchi or rales.  Abdominal:     General: Bowel sounds are normal.     Palpations: Abdomen is soft.     Tenderness: There is no abdominal tenderness.  Musculoskeletal:     Comments: Foot mild warmth. Nontender.   Lymphadenopathy:     Cervical: No cervical adenopathy.  Skin:    Findings: No lesion or rash.  Neurological:     Mental Status: He is alert and oriented to person, place, and time.  Psychiatric:        Mood and Affect: Mood normal.        Behavior: Behavior normal.     Lab Results  Component Value Date   CHOL 199 10/13/2019   TRIG 104 10/13/2019   HDL 54 10/13/2019   LDLCALC 126 (H) 10/13/2019      Assessment & Plan:  1. Encounter for routine adult medical exam with abnormal findings Patient is doing remarkably well in light of having metastatic pancreatic  cancer and undergoing chemotherapy.  Patient is going to get labs at oncology tomorrow. - CBC with Differential/Platelet - Comprehensive metabolic panel - Lipid panel - TSH  2. Screening for prostate cancer - PSA  3. 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  Increase allopurinol 300 mg once daily  - Uric acid   Body mass index is 29.57 kg/m.   Defer recommendations for vaccines to oncology as I have discussed with him and he would like to discuss with oncology.   This is a list of the screening recommended for you and due dates:   Health Maintenance  Topic Date Due  .  Hepatitis C: One time screening is recommended by Center for Disease Control  (CDC) for  adults born from [birth year] through 1967.   Never done  . COVID-19 Vaccine (1) Never done  . HIV Screening  Never done  . Tetanus Vaccine  Never done  . Colon Cancer Screening  Never done  . Flu Shot  Never done     AN INDIVIDUALIZED CARE PLAN: was established or reinforced today.   SELF MANAGEMENT: The patient and I together assessed ways to personally work towards obtaining the recommended goals  Support needs The patient and/or family needs were assessed and services were offered if appropriate.  Meds ordered this encounter  Medications  . allopurinol (ZYLOPRIM) 300 MG tablet    Sig: Take 1 tablet (300 mg total) by mouth daily.    Dispense:  30 tablet    Refill:  6  . colchicine 0.6 MG tablet    Sig: Take 1 tablet (0.6 mg total) by mouth daily.    Dispense:  90 tablet    Refill:  1    Follow-up: Return in about 6 months (around 08/14/2020).  An After Visit Summary was printed and given to the patient.  Rochel Brome [facility] [phone]

## 2020-02-19 ENCOUNTER — Encounter: Payer: Self-pay | Admitting: Family Medicine

## 2020-03-27 ENCOUNTER — Other Ambulatory Visit: Payer: Self-pay

## 2020-03-27 MED ORDER — ESOMEPRAZOLE MAGNESIUM 40 MG PO CPDR: 40.0000 mg | DELAYED_RELEASE_CAPSULE | Freq: Every day | ORAL | 3 refills | Status: DC

## 2020-04-03 ENCOUNTER — Other Ambulatory Visit: Payer: Self-pay | Admitting: Family Medicine

## 2020-04-10 ENCOUNTER — Encounter: Payer: Self-pay | Admitting: Family Medicine

## 2020-04-10 ENCOUNTER — Other Ambulatory Visit: Payer: Self-pay

## 2020-04-10 ENCOUNTER — Ambulatory Visit (INDEPENDENT_AMBULATORY_CARE_PROVIDER_SITE_OTHER): Payer: Managed Care, Other (non HMO) | Admitting: Family Medicine

## 2020-04-10 VITALS — BP 138/96 | HR 88 | Temp 97.5°F | Resp 18 | Ht 71.0 in | Wt 203.6 lb

## 2020-04-10 DIAGNOSIS — I10 Essential (primary) hypertension: Secondary | ICD-10-CM | POA: Diagnosis not present

## 2020-04-10 MED ORDER — AMLODIPINE BESYLATE 10 MG PO TABS
10.0000 mg | ORAL_TABLET | Freq: Every day | ORAL | 1 refills | Status: DC
Start: 2020-04-10 — End: 2020-10-18

## 2020-04-10 MED ORDER — LISINOPRIL 20 MG PO TABS: 20.0000 mg | ORAL_TABLET | Freq: Every day | ORAL | 0 refills | Status: DC

## 2020-04-10 NOTE — Progress Notes (Signed)
Acute Office Visit  Subjective:    [name], male    DOB: 1966-05-04, 54 y.o.   MRN: [id number]  Chief Complaint  Patient presents with  . Hypertension    HPI Patient is in today for elevated for BP over the last 4 weeks. Gradually increasing. Currenlty on lisinorpil 10 mg once daily and amlodipine 10 mg once daily. Headache at times.  Past Medical History:  Diagnosis Date  . Barrett's esophagus   . Cervicalgia   . Essential hypertension   . GERD (gastroesophageal reflux disease)   . History of COVID-19    05/2019  . Mixed hyperlipidemia   . Pancreatic carcinoma metastatic to liver ([place])   . Primary malignant neoplasm of head of pancreas [facility])     Past Surgical History:  Procedure Laterality Date  . LAPAROSCOPIC CHOLECYSTECTOMY  01/05/2015  . NASAL POLYP SURGERY    . REFRACTIVE SURGERY Bilateral 1988  . WHIPPLE PROCEDURE  11/09/2015   Pylorus sparring    Family History  Problem Relation Age of Onset  . Cancer Mother        breast  . Hypertension Father   . Hyperlipidemia Father   . Cancer Father        prostate  . Hypertension Sister     Social History   Socioeconomic History  . Marital status: Married    Spouse name: Not on file  . Number of children: 2  . Years of education: Not on file  . Highest education level: Not on file  Occupational History  . Occupation: Retired    Comment: [employer]  Tobacco Use  . Smoking status: Former Smoker    Types: Cigars    Quit date: 2005    Years since quitting: 16.9  . Smokeless tobacco: Never Used  Substance and Sexual Activity  . Alcohol use: Not Currently  . Drug use: Never  . Sexual activity: Yes    Partners: Female  Other Topics Concern  . Not on file  Social History Narrative  . Not on file   Social Determinants of Health   Financial Resource Strain:   . Difficulty of Paying Living Expenses: Not on file  Food Insecurity:   . Worried About Charity fundraiser in the Last  Year: Not on file  . Ran Out of Food in the Last Year: Not on file  Transportation Needs:   . Lack of Transportation (Medical): Not on file  . Lack of Transportation (Non-Medical): Not on file  Physical Activity:   . Days of Exercise per Week: Not on file  . Minutes of Exercise per Session: Not on file  Stress:   . Feeling of Stress : Not on file  Social Connections:   . Frequency of Communication with Friends and Family: Not on file  . Frequency of Social Gatherings with Friends and Family: Not on file  . Attends Religious Services: Not on file  . Active Member of Clubs or Organizations: Not on file  . Attends Archivist Meetings: Not on file  . Marital Status: Not on file  Intimate Partner Violence:   . Fear of Current or Ex-Partner: Not on file  . Emotionally Abused: Not on file  . Physically Abused: Not on file  . Sexually Abused: Not on file    Outpatient Medications Prior to Visit  Medication Sig Dispense Refill  . acetaminophen (TYLENOL) 500 MG tablet Take 500 mg by mouth every 6 (six) hours as needed.    Marland Kitchen  allopurinol (ZYLOPRIM) 300 MG tablet Take 1 tablet (300 mg total) by mouth daily. 30 tablet 6  . colchicine 0.6 MG tablet Take 1 tablet (0.6 mg total) by mouth daily. 90 tablet 1  . CREON 36000-114000 units CPEP capsule TAKE 3 capsules with meals. 1 after starting/2 when finished eating. Take 2 capsules with snack after starting to eat.    . Cyanocobalamin (VITAMIN B-12 PO) Take by mouth.    . esomeprazole (NEXIUM) 40 MG capsule Take 1 capsule (40 mg total) by mouth daily. 90 capsule 3  . gabapentin (NEURONTIN) 300 MG capsule Take by mouth.    . guaifenesin (HUMIBID E) 400 MG TABS tablet Take by mouth as needed.    Marland Kitchen LORazepam (ATIVAN) 0.5 MG tablet Take 0.5 mg by mouth every 6 (six) hours as needed.    . meloxicam (MOBIC) 7.5 MG tablet TAKE ONE TABLET BY MOUTH TWICE DAILY 60 tablet 11  . Multiple Vitamin (MULTIVITAMIN) capsule Take 1 capsule by mouth daily.      . Omega-3 1000 MG CAPS Take by mouth.    . Pyridoxine HCl (VITAMIN B-6 PO) Take by mouth.    . Riboflavin (VITAMIN B-2 PO) Take by mouth.    . Thiamine HCl (VITAMIN B-1 PO) Take by mouth.    . traMADol (ULTRAM) 50 MG tablet Take by mouth.    Marland Kitchen amLODipine (NORVASC) 5 MG tablet TAKE ONE TABLET BY MOUTH TWICE DAILY (Patient taking differently: Take 10 mg by mouth daily. ) 180 tablet 1  . lisinopril (ZESTRIL) 10 MG tablet Take 1 tablet by mouth once daily 90 tablet 1  . linaclotide (LINZESS) 72 MCG capsule as needed. (Patient not taking: Reported on 04/10/2020)    . polyethylene glycol powder (GLYCOLAX/MIRALAX) 17 GM/SCOOP powder Take by mouth. (Patient not taking: Reported on 04/10/2020)     No facility-administered medications prior to visit.    Allergies  Allergen Reactions  . Trazodone And Nefazodone     Somnolence. Cloudy feeling.     Review of Systems  Constitutional: Positive for appetite change and fatigue. Negative for chills and fever.  HENT: Positive for sinus pain. Negative for congestion, rhinorrhea and sore throat.   Respiratory: Negative for cough and shortness of breath.   Cardiovascular: Positive for chest pain (Referred pain from [facility]. Taking tramadol and gabapentin. ). Negative for palpitations.  Gastrointestinal: Positive for abdominal pain (cramping from diarrhea. ), diarrhea (with chemo) and nausea (with chemo). Negative for constipation and vomiting.  Genitourinary: Positive for frequency. Negative for dysuria and urgency.  Musculoskeletal: Negative for arthralgias, back pain and myalgias.  Neurological: Positive for headaches. Negative for dizziness.  Psychiatric/Behavioral: Negative for dysphoric mood. The patient is nervous/anxious.        Objective:    Physical Exam Vitals reviewed.  Constitutional:      Appearance: Normal appearance.  Neck:     Vascular: No carotid bruit.  Cardiovascular:     Rate and Rhythm: Normal rate and regular rhythm.      Pulses: Normal pulses.     Heart sounds: Normal heart sounds.  Pulmonary:     Effort: Pulmonary effort is normal.     Breath sounds: Normal breath sounds. No wheezing, rhonchi or rales.  Neurological:     Mental Status: He is alert and oriented to person, place, and time.  Psychiatric:        Mood and Affect: Mood normal.        Behavior: Behavior normal.    BP Marland Kitchen)  138/96   Pulse 88   Temp (!) 97.5 F (36.4 C)   Resp 18   Ht _0  (1.803 m)   Wt 203 lb 9.6 oz (92.4 kg)   BMI 28.40 kg/m  Wt Readings from Last 3 Encounters:  04/10/20 203 lb 9.6 oz (92.4 kg)  02/14/20 212 lb (96.2 kg)  01/18/20 209 lb (94.8 kg)    Health Maintenance Due  Topic Date Due  . Hepatitis C Screening  Never done  . COVID-19 Vaccine (1) Never done  . HIV Screening  Never done  . TETANUS/TDAP  Never done  . COLONOSCOPY  Never done  . INFLUENZA VACCINE  Never done    There are no preventive care reminders to display for this patient.   No results found for: TSH No results found for: WBC, HGB, HCT, MCV, PLT No results found for: NA, K, CHLORIDE, CO2, GLUCOSE, BUN, CREATININE, BILITOT, ALKPHOS, AST, ALT, PROT, ALBUMIN, CALCIUM, ANIONGAP, EGFR, GFR Lab Results  Component Value Date   CHOL 199 10/13/2019   Lab Results  Component Value Date   HDL 54 10/13/2019   Lab Results  Component Value Date   LDLCALC 126 (H) 10/13/2019   Lab Results  Component Value Date   TRIG 104 10/13/2019   Lab Results  Component Value Date   CHOLHDL 3.7 10/13/2019   No results found for: HGBA1C     Assessment & Plan:  Confused congested ed Increase Zestril 20 mg once daily.  Continue amlodipine 10 mg once daily. Patient to monitor blood pressure at oncology appointments.  Also chemistry panel should be rechecked in approximately 2 weeks.  He is getting these on a regular basis at oncology.  Patient to call back if blood pressure continues to be elevated or if kidney function worsens upon the higher dose  of medicine.  Meds ordered this encounter  Medications  . lisinopril (ZESTRIL) 20 MG tablet    Sig: Take 1 tablet (20 mg total) by mouth daily.    Dispense:  90 tablet    Refill:  0  . amLODipine (NORVASC) 10 MG tablet    Sig: Take 1 tablet (10 mg total) by mouth daily.    Dispense:  90 tablet    Refill:  1    No orders of the defined types were placed in this encounter.   Follow-up:  Prn  An After Visit Summary was printed and given to the patient.  Rochel Brome, MD [facility] [phone]

## 2020-04-10 NOTE — Patient Instructions (Signed)
Relion brand generic at [facility].   Recommend increase lisinopril to 20 mg once daily.  Continue amlodipine to 10 mg once daily.

## 2020-04-26 ENCOUNTER — Other Ambulatory Visit: Payer: Self-pay | Admitting: Family Medicine

## 2020-05-08 ENCOUNTER — Other Ambulatory Visit: Payer: Self-pay | Admitting: Physician Assistant

## 2020-05-08 ENCOUNTER — Telehealth: Payer: Self-pay

## 2020-05-08 MED ORDER — KETOCONAZOLE 2 % EX CREA: 1.0000 "application " | TOPICAL_CREAM | Freq: Two times a day (BID) | CUTANEOUS | 0 refills | Status: DC

## 2020-05-08 NOTE — Telephone Encounter (Signed)
Daniel Neal called to report that he completed antibiotics for an internal infection and is now experiencing a yeast infection.  He is complaining of a penile rash which he has used OTC yeast medication with no relief.

## 2020-07-30 ENCOUNTER — Telehealth (INDEPENDENT_AMBULATORY_CARE_PROVIDER_SITE_OTHER): Payer: Managed Care, Other (non HMO) | Admitting: Nurse Practitioner

## 2020-07-30 ENCOUNTER — Encounter: Payer: Self-pay | Admitting: Nurse Practitioner

## 2020-07-30 VITALS — Ht 71.0 in | Wt 203.0 lb

## 2020-07-30 DIAGNOSIS — J0181 Other acute recurrent sinusitis: Secondary | ICD-10-CM | POA: Diagnosis not present

## 2020-07-30 MED ORDER — AMOXICILLIN-POT CLAVULANATE 875-125 MG PO TABS: 1.0000 | ORAL_TABLET | Freq: Two times a day (BID) | ORAL | 0 refills | Status: DC

## 2020-07-30 NOTE — Progress Notes (Deleted)
Acute Office Visit  Subjective:    [name], male    DOB: 1965/10/31, 55 y.o.   MRN: [id number]  No chief complaint on file.   HPI Patient is in today for ***  Past Medical History:  Diagnosis Date  . Barrett's esophagus   . Cervicalgia   . Essential hypertension   . GERD (gastroesophageal reflux disease)   . History of COVID-19    05/2019  . Mixed hyperlipidemia   . Pancreatic carcinoma metastatic to liver ([place])   . Primary malignant neoplasm of head of pancreas [facility])     Past Surgical History:  Procedure Laterality Date  . LAPAROSCOPIC CHOLECYSTECTOMY  01/05/2015  . NASAL POLYP SURGERY    . REFRACTIVE SURGERY Bilateral 1988  . WHIPPLE PROCEDURE  11/09/2015   Pylorus sparring    Family History  Problem Relation Age of Onset  . Cancer Mother        breast  . Hypertension Father   . Hyperlipidemia Father   . Cancer Father        prostate  . Hypertension Sister     Social History   Socioeconomic History  . Marital status: Married    Spouse name: Not on file  . Number of children: 2  . Years of education: Not on file  . Highest education level: Not on file  Occupational History  . Occupation: Retired    Comment: [employer]  Tobacco Use  . Smoking status: Former Smoker    Types: Cigars    Quit date: 2005    Years since quitting: 17.2  . Smokeless tobacco: Never Used  Substance and Sexual Activity  . Alcohol use: Not Currently  . Drug use: Never  . Sexual activity: Yes    Partners: Female  Other Topics Concern  . Not on file  Social History Narrative  . Not on file   Social Determinants of Health   Financial Resource Strain: Not on file  Food Insecurity: Not on file  Transportation Needs: Not on file  Physical Activity: Not on file  Stress: Not on file  Social Connections: Not on file  Intimate Partner Violence: Not on file    Outpatient Medications Prior to Visit  Medication Sig Dispense Refill  . acetaminophen  (TYLENOL) 500 MG tablet Take 500 mg by mouth every 6 (six) hours as needed.    Marland Kitchen allopurinol (ZYLOPRIM) 300 MG tablet Take 1 tablet (300 mg total) by mouth daily. 30 tablet 6  . amLODipine (NORVASC) 10 MG tablet Take 1 tablet (10 mg total) by mouth daily. 90 tablet 1  . colchicine 0.6 MG tablet Take 1 tablet (0.6 mg total) by mouth daily. 90 tablet 1  . CREON 36000-114000 units CPEP capsule TAKE 3 capsules with meals. 1 after starting/2 when finished eating. Take 2 capsules with snack after starting to eat.    . Cyanocobalamin (VITAMIN B-12 PO) Take by mouth.    . esomeprazole (NEXIUM) 40 MG capsule Take 1 capsule (40 mg total) by mouth daily. 90 capsule 3  . gabapentin (NEURONTIN) 300 MG capsule Take by mouth.    . guaifenesin (HUMIBID E) 400 MG TABS tablet Take by mouth as needed.    Marland Kitchen ketoconazole (NIZORAL) 2 % cream Apply 1 application topically 2 (two) times daily. 15 g 0  . linaclotide (LINZESS) 72 MCG capsule as needed. (Patient not taking: Reported on 04/10/2020)    . lisinopril (ZESTRIL) 10 MG tablet Take 1 tablet by mouth once  daily 90 tablet 1  . lisinopril (ZESTRIL) 20 MG tablet Take 1 tablet (20 mg total) by mouth daily. 90 tablet 0  . LORazepam (ATIVAN) 0.5 MG tablet Take 0.5 mg by mouth every 6 (six) hours as needed.    . meloxicam (MOBIC) 7.5 MG tablet TAKE ONE TABLET BY MOUTH TWICE DAILY 60 tablet 11  . Multiple Vitamin (MULTIVITAMIN) capsule Take 1 capsule by mouth daily.    . Omega-3 1000 MG CAPS Take by mouth.    . polyethylene glycol powder (GLYCOLAX/MIRALAX) 17 GM/SCOOP powder Take by mouth. (Patient not taking: Reported on 04/10/2020)    . Pyridoxine HCl (VITAMIN B-6 PO) Take by mouth.    . Riboflavin (VITAMIN B-2 PO) Take by mouth.    . Thiamine HCl (VITAMIN B-1 PO) Take by mouth.    . traMADol (ULTRAM) 50 MG tablet Take by mouth.     No facility-administered medications prior to visit.    Allergies  Allergen Reactions  . Trazodone And Nefazodone     Somnolence.  Cloudy feeling.     Review of Systems     Objective:    Physical Exam  There were no vitals taken for this visit. Wt Readings from Last 3 Encounters:  04/10/20 203 lb 9.6 oz (92.4 kg)  02/14/20 212 lb (96.2 kg)  01/18/20 209 lb (94.8 kg)    Health Maintenance Due  Topic Date Due  . Hepatitis C Screening  Never done  . COVID-19 Vaccine (1) Never done  . HIV Screening  Never done  . TETANUS/TDAP  Never done  . COLONOSCOPY (Pts 45-55yr Insurance coverage will need to be confirmed)  Never done  . INFLUENZA VACCINE  Never done    There are no preventive care reminders to display for this patient.   No results found for: TSH No results found for: WBC, HGB, HCT, MCV, PLT No results found for: NA, K, CHLORIDE, CO2, GLUCOSE, BUN, CREATININE, BILITOT, ALKPHOS, AST, ALT, PROT, ALBUMIN, CALCIUM, ANIONGAP, EGFR, GFR Lab Results  Component Value Date   CHOL 199 10/13/2019   Lab Results  Component Value Date   HDL 54 10/13/2019   Lab Results  Component Value Date   LDLCALC 126 (H) 10/13/2019   Lab Results  Component Value Date   TRIG 104 10/13/2019   Lab Results  Component Value Date   CHOLHDL 3.7 10/13/2019   No results found for: HGBA1C     Assessment & Plan:   Problem List Items Addressed This Visit   None      No orders of the defined types were placed in this encounter.    SRip Harbour NP

## 2020-07-30 NOTE — Progress Notes (Signed)
Virtual Visit via Telephone Note   This visit type was conducted due to national recommendations for restrictions regarding the COVID-19 Pandemic (e.g. social distancing) in an effort to limit this patient's exposure and mitigate transmission in our community.  Due to his co-morbid illnesses, this patient is at least at moderate risk for complications without adequate follow up.  This format is felt to be most appropriate for this patient at this time.  The patient did not have access to video technology/had technical difficulties with video requiring transitioning to audio format only (telephone).  All issues noted in this document were discussed and addressed.  No physical exam could be performed with this format.  Patient verbally consented to a telehealth visit.   Date:  07/30/2020   ID:  Daniel Neal, DOB 03-24-1966, MRN [id number]  Patient Location: Home Provider Location: Office/Clinic  PCP:  Rochel Brome, MD   Evaluation Performed:  Established patient, acute telemedicine visit  Chief Complaint:  Sinus congestion  History of Present Illness:    Daniel Neal is a 55 y.o. male with nasal congestion, sinus pain/pressure, fever(101 F) , right eye and ear pain. Pt states he is also experiencing post-nasal-drip that is causing a non-productive cough. Onset of symptoms was two weeks ago. Treatment has included nasal saline Navage, Zyrtec, Benadryl, and Guaifenesin, and Ricola cough drops. He tells me that he has a history of nasal polyps. He is seen by ENT at [facility]. Pt has history of pancreatic cancer that has metastasized to liver. He is currently receiving chemotherapy at [facility].  The patient does have symptoms concerning for COVID-19 infection (fever, chills, cough, or new shortness of breath).    Past Medical History:  Diagnosis Date  . Barrett's esophagus   . Cervicalgia   . Essential hypertension   . GERD (gastroesophageal reflux disease)   . History of  COVID-19    05/2019  . Mixed hyperlipidemia   . Pancreatic carcinoma metastatic to liver ([place])   . Primary malignant neoplasm of head of pancreas [facility])     Past Surgical History:  Procedure Laterality Date  . LAPAROSCOPIC CHOLECYSTECTOMY  01/05/2015  . NASAL POLYP SURGERY    . REFRACTIVE SURGERY Bilateral 1988  . WHIPPLE PROCEDURE  11/09/2015   Pylorus sparring    Family History  Problem Relation Age of Onset  . Cancer Mother        breast  . Hypertension Father   . Hyperlipidemia Father   . Cancer Father        prostate  . Hypertension Sister     Social History   Socioeconomic History  . Marital status: Married    Spouse name: Not on file  . Number of children: 2  . Years of education: Not on file  . Highest education level: Not on file  Occupational History  . Occupation: Retired    Comment: [employer]  Tobacco Use  . Smoking status: Former Smoker    Types: Cigars    Quit date: 2005    Years since quitting: 17.2  . Smokeless tobacco: Never Used  Substance and Sexual Activity  . Alcohol use: Not Currently  . Drug use: Never  . Sexual activity: Yes    Partners: Female  Other Topics Concern  . Not on file  Social History Narrative  . Not on file   Social Determinants of Health   Financial Resource Strain: Not on file  Food Insecurity: Not on file  Transportation Needs: Not  on file  Physical Activity: Not on file  Stress: Not on file  Social Connections: Not on file  Intimate Partner Violence: Not on file    Outpatient Medications Prior to Visit  Medication Sig Dispense Refill  . acetaminophen (TYLENOL) 500 MG tablet Take 500 mg by mouth every 6 (six) hours as needed.    Marland Kitchen allopurinol (ZYLOPRIM) 300 MG tablet Take 1 tablet (300 mg total) by mouth daily. 30 tablet 6  . amLODipine (NORVASC) 10 MG tablet Take 1 tablet (10 mg total) by mouth daily. 90 tablet 1  . colchicine 0.6 MG tablet Take 1 tablet (0.6 mg total) by mouth daily. 90 tablet 1  .  CREON 36000-114000 units CPEP capsule TAKE 3 capsules with meals. 1 after starting/2 when finished eating. Take 2 capsules with snack after starting to eat.    . Cyanocobalamin (VITAMIN B-12 PO) Take by mouth.    . esomeprazole (NEXIUM) 40 MG capsule Take 1 capsule (40 mg total) by mouth daily. 90 capsule 3  . gabapentin (NEURONTIN) 300 MG capsule Take by mouth.    . guaifenesin (HUMIBID E) 400 MG TABS tablet Take by mouth as needed.    Marland Kitchen ketoconazole (NIZORAL) 2 % cream Apply 1 application topically 2 (two) times daily. 15 g 0  . linaclotide (LINZESS) 72 MCG capsule as needed. (Patient not taking: Reported on 04/10/2020)    . lisinopril (ZESTRIL) 10 MG tablet Take 1 tablet by mouth once daily 90 tablet 1  . lisinopril (ZESTRIL) 20 MG tablet Take 1 tablet (20 mg total) by mouth daily. 90 tablet 0  . LORazepam (ATIVAN) 0.5 MG tablet Take 0.5 mg by mouth every 6 (six) hours as needed.    . meloxicam (MOBIC) 7.5 MG tablet TAKE ONE TABLET BY MOUTH TWICE DAILY 60 tablet 11  . Multiple Vitamin (MULTIVITAMIN) capsule Take 1 capsule by mouth daily.    . Omega-3 1000 MG CAPS Take by mouth.    . polyethylene glycol powder (GLYCOLAX/MIRALAX) 17 GM/SCOOP powder Take by mouth. (Patient not taking: Reported on 04/10/2020)    . Pyridoxine HCl (VITAMIN B-6 PO) Take by mouth.    . Riboflavin (VITAMIN B-2 PO) Take by mouth.    . Thiamine HCl (VITAMIN B-1 PO) Take by mouth.    . traMADol (ULTRAM) 50 MG tablet Take by mouth.     No facility-administered medications prior to visit.    Allergies:   Trazodone and nefazodone   Social History   Tobacco Use  . Smoking status: Former Smoker    Types: Cigars    Quit date: 2005    Years since quitting: 17.2  . Smokeless tobacco: Never Used  Substance Use Topics  . Alcohol use: Not Currently  . Drug use: Never     Review of Systems  Constitutional: Positive for fever and malaise/fatigue.  HENT: Positive for congestion, ear pain (right), sinus pain and sore  throat (post-nasal-drip causing "scratchiness").   Eyes: Positive for pain (right).  Respiratory: Positive for cough (dry). Negative for shortness of breath and wheezing.   Cardiovascular: Negative for chest pain, palpitations and leg swelling.  Gastrointestinal: Negative for abdominal pain, diarrhea, heartburn, nausea and vomiting.  Genitourinary: Negative for dysuria.  Musculoskeletal: Negative for back pain, joint pain, myalgias and neck pain.  Skin: Negative for rash.  Neurological: Positive for headaches. Negative for dizziness and weakness.     Labs/Other Tests and Data Reviewed:    Recent Labs: No results found for requested labs within last  8760 hours.   Recent Lipid Panel Lab Results  Component Value Date/Time   CHOL 199 10/13/2019 11:49 AM   TRIG 104 10/13/2019 11:49 AM   HDL 54 10/13/2019 11:49 AM   CHOLHDL 3.7 10/13/2019 11:49 AM   LDLCALC 126 (H) 10/13/2019 11:49 AM    Wt Readings from Last 3 Encounters:  04/10/20 203 lb 9.6 oz (92.4 kg)  02/14/20 212 lb (96.2 kg)  01/18/20 209 lb (94.8 kg)     Objective:    Vital Signs:  Ht 5\' 11"  (1.803 m)   Wt 203 lb (92.1 kg)   BMI 28.31 kg/m   Physical Exam Vitals reviewed.    No physical exam performed due to telemedicine visit  ASSESSMENT & PLAN:    1. Other acute recurrent sinusitis - amoxicillin-clavulanate (AUGMENTIN) 875-125 MG tablet; Take 1 tablet by mouth 2 (two) times daily.  Dispense: 20 tablet; Refill: 0   Rest and push fluids  Continue Tylenol as needed for discomfort Notify office if no improvement in 48 hours  Seek emergency medical care for severe symptoms   COVID-19 Education: The signs and symptoms of COVID-19 were discussed with the patient and how to seek care for testing (follow up with PCP or arrange E-visit). The importance of social distancing was discussed today.   I spent 12 minutes dedicated to the care of this patient on the date of this encounter to include telephone time with  the patient, as well as:EMR and prescription medication.   Follow Up:  Virtual Visit  prn  Signed,  Rip Harbour, NP  07/30/2020 1:29 PM    [place]

## 2020-08-23 ENCOUNTER — Other Ambulatory Visit: Payer: Self-pay | Admitting: Family Medicine

## 2020-08-23 MED ORDER — FEBUXOSTAT 40 MG PO TABS: 40.0000 mg | ORAL_TABLET | Freq: Every day | ORAL | 1 refills | Status: DC

## 2020-08-23 MED ORDER — LISINOPRIL 20 MG PO TABS
20.0000 mg | ORAL_TABLET | Freq: Every day | ORAL | 1 refills | Status: DC
Start: 2020-08-23 — End: 2021-02-25

## 2020-08-23 NOTE — Telephone Encounter (Signed)
I spoke with patient about gout flare ups. Patient has been on allopurinol and colchicine x 6 months.  Pt has had gout flare ups despite being on medicines.  Recommend change allopurinol to uloric 40 mg once daily.  Pt got refill of lisinopril and was 10 mg daily and should have been 20 mg daily. His bp has been increasing.

## 2020-08-31 ENCOUNTER — Encounter: Payer: Self-pay | Admitting: Family Medicine

## 2020-08-31 ENCOUNTER — Telehealth (INDEPENDENT_AMBULATORY_CARE_PROVIDER_SITE_OTHER): Payer: Managed Care, Other (non HMO) | Admitting: Family Medicine

## 2020-08-31 VITALS — Temp 99.6°F | Wt 210.0 lb

## 2020-08-31 DIAGNOSIS — J0181 Other acute recurrent sinusitis: Secondary | ICD-10-CM

## 2020-08-31 MED ORDER — AMOXICILLIN-POT CLAVULANATE 875-125 MG PO TABS: 1.0000 | ORAL_TABLET | Freq: Two times a day (BID) | ORAL | 0 refills | Status: DC

## 2020-08-31 NOTE — Progress Notes (Signed)
Virtual Visit via Video Note   This visit type was conducted due to national recommendations for restrictions regarding the COVID-19 Pandemic (e.g. social distancing) in an effort to limit this patient's exposure and mitigate transmission in our community.  Due to his co-morbid illnesses, this patient is at least at moderate risk for complications without adequate follow up.  This format is felt to be most appropriate for this patient at this time.  All issues noted in this document were discussed and addressed.  A limited physical exam was performed with this format.  A verbal consent was obtained for the virtual visit.   Date:  08/31/2020   ID:  Daniel Neal, DOB [birth year], MRN [id number]  Patient Location: Home Provider Location: Office/Clinic  PCP:  Rochel Brome, MD   Evaluation Performed: acute  Chief Complaint:  sinusitis  History of Present Illness:    Daniel Neal is a 55 y.o. male with left-sided facial pain and pressure x1 week.  He is scheduled to get return to his ear nose and throat specialist on May 9 as he has a history of nasal polyps which periodically if needed treatment.  He is currently taking Zyrtec in the morning and Benadryl in the evening.  He is using a sinus lavage he can feel that nothing seems to be moving through his sinus system on the left side but is easily moves through the right side.  He is taking guaifenesin slow release for his cough.  He denies any fever he has had some chills.  Has had some earaches.  Cough is mainly secondary to drainage.  The patient does was present after he was seen exposed.  I have symptoms concerning for COVID-19 infection (fever, chills, cough, or new shortness of breath).    Past Medical History:  Diagnosis Date  . Barrett's esophagus   . Cervicalgia   . Essential hypertension   . GERD (gastroesophageal reflux disease)   . History of COVID-19    05/2019  . Mixed hyperlipidemia   . Pancreatic carcinoma metastatic  to liver ([place])   . Primary malignant neoplasm of head of pancreas [facility])     Past Surgical History:  Procedure Laterality Date  . LAPAROSCOPIC CHOLECYSTECTOMY  01/05/2015  . NASAL POLYP SURGERY    . REFRACTIVE SURGERY Bilateral 1988  . WHIPPLE PROCEDURE  11/09/2015   Pylorus sparring    Family History  Problem Relation Age of Onset  . Cancer Mother        breast  . Hypertension Father   . Hyperlipidemia Father   . Cancer Father        prostate  . Hypertension Sister     Social History   Socioeconomic History  . Marital status: Married    Spouse name: Not on file  . Number of children: 2  . Years of education: Not on file  . Highest education level: Not on file  Occupational History  . Occupation: Retired    Comment: [employer]  Tobacco Use  . Smoking status: Former Smoker    Types: Cigars    Quit date: 2005    Years since quitting: 17.3  . Smokeless tobacco: Never Used  Substance and Sexual Activity  . Alcohol use: Not Currently  . Drug use: Never  . Sexual activity: Yes    Partners: Female  Other Topics Concern  . Not on file  Social History Narrative  . Not on file   Social Determinants of Health   Financial  Resource Strain: Not on file  Food Insecurity: Not on file  Transportation Needs: Not on file  Physical Activity: Not on file  Stress: Not on file  Social Connections: Not on file  Intimate Partner Violence: Not on file    Outpatient Medications Prior to Visit  Medication Sig Dispense Refill  . acetaminophen (TYLENOL) 500 MG tablet Take 500 mg by mouth every 6 (six) hours as needed.    Marland Kitchen amLODipine (NORVASC) 10 MG tablet Take 1 tablet (10 mg total) by mouth daily. 90 tablet 1  . colchicine 0.6 MG tablet Take 1 tablet (0.6 mg total) by mouth daily. 90 tablet 1  . CREON 36000-114000 units CPEP capsule TAKE 3 capsules with meals. 1 after starting/2 when finished eating. Take 2 capsules with snack after starting to eat.    . Cyanocobalamin  (VITAMIN B-12 PO) Take by mouth.    . cyclobenzaprine (FLEXERIL) 5 MG tablet Take 5 mg by mouth 3 (three) times daily as needed.    . dicyclomine (BENTYL) 10 MG capsule Take 10 mg by mouth 3 (three) times daily as needed.    Marland Kitchen esomeprazole (NEXIUM) 40 MG capsule Take 1 capsule (40 mg total) by mouth daily. 90 capsule 3  . febuxostat (ULORIC) 40 MG tablet Take 1 tablet (40 mg total) by mouth daily. 90 tablet 1  . gabapentin (NEURONTIN) 300 MG capsule Take by mouth.    . guaifenesin (HUMIBID E) 400 MG TABS tablet Take by mouth as needed.    Marland Kitchen ketoconazole (NIZORAL) 2 % cream Apply 1 application topically 2 (two) times daily. 15 g 0  . linaclotide (LINZESS) 72 MCG capsule as needed.    Marland Kitchen lisinopril (ZESTRIL) 20 MG tablet Take 1 tablet (20 mg total) by mouth daily. 90 tablet 1  . LORazepam (ATIVAN) 1 MG tablet Take 1 mg by mouth every 6 (six) hours as needed.    . meloxicam (MOBIC) 7.5 MG tablet TAKE ONE TABLET BY MOUTH TWICE DAILY 60 tablet 11  . Multiple Vitamin (MULTIVITAMIN) capsule Take 1 capsule by mouth daily.    . Omega-3 1000 MG CAPS Take by mouth.    . polyethylene glycol powder (GLYCOLAX/MIRALAX) 17 GM/SCOOP powder Take by mouth.    . prochlorperazine (COMPAZINE) 10 MG tablet Take 10 mg by mouth every 6 (six) hours as needed.    . Pyridoxine HCl (VITAMIN B-6 PO) Take by mouth.    . Riboflavin (VITAMIN B-2 PO) Take by mouth.    . Thiamine HCl (VITAMIN B-1 PO) Take by mouth.    . traMADol (ULTRAM) 50 MG tablet Take by mouth.    Marland Kitchen amoxicillin-clavulanate (AUGMENTIN) 875-125 MG tablet Take 1 tablet by mouth 2 (two) times daily. 20 tablet 0   No facility-administered medications prior to visit.    Allergies:   Trazodone and nefazodone   Social History   Tobacco Use  . Smoking status: Former Smoker    Types: Cigars    Quit date: 2005    Years since quitting: 17.3  . Smokeless tobacco: Never Used  Substance Use Topics  . Alcohol use: Not Currently  . Drug use: Never     Review  of Systems  Constitutional: Positive for chills. Negative for fever and malaise/fatigue.  HENT: Positive for congestion, ear pain and sinus pain. Negative for sore throat.   Respiratory: Negative for cough and shortness of breath.   Cardiovascular: Negative for chest pain.  Gastrointestinal: Negative for constipation, diarrhea, nausea and vomiting.  Neurological: Positive for  headaches.     Labs/Other Tests and Data Reviewed:    Recent Labs: No results found for requested labs within last 8760 hours.   Recent Lipid Panel Lab Results  Component Value Date/Time   CHOL 199 10/13/2019 11:49 AM   TRIG 104 10/13/2019 11:49 AM   HDL 54 10/13/2019 11:49 AM   CHOLHDL 3.7 10/13/2019 11:49 AM   LDLCALC 126 (H) 10/13/2019 11:49 AM    Wt Readings from Last 3 Encounters:  07/30/20 203 lb (92.1 kg)  04/10/20 203 lb 9.6 oz (92.4 kg)  02/14/20 212 lb (96.2 kg)     Objective:    Vital Signs:  There were no vitals taken for this visit.   Physical Exam   ASSESSMENT & PLAN:   1. Other acute recurrent sinusitis - amoxicillin-clavulanate (AUGMENTIN) 875-125 MG tablet; Take 1 tablet by mouth 2 (two) times daily.  Dispense: 20 tablet; Refill: 0 Continue Zyrtec and Benadryl.  May continue guaifenesin and nasal lavage.  Keep appointment with ear nose and throat specialist.  Meds ordered this encounter  Medications  . amoxicillin-clavulanate (AUGMENTIN) 875-125 MG tablet    Sig: Take 1 tablet by mouth 2 (two) times daily.    Dispense:  20 tablet    Refill:  0    COVID-19 Education: The signs and symptoms of COVID-19 were discussed with the patient and how to seek care for testing (follow up with PCP or arrange E-visit). The importance of social distancing was discussed today.   I spent 8 minutes dedicated to the care of this patient on the date of this encounter to include face-to-face time with the patient.  Follow Up:  In Person prn  Signed, Rochel Brome, MD  08/31/2020 9:34 AM    [place]

## 2020-10-12 ENCOUNTER — Other Ambulatory Visit: Payer: Self-pay

## 2020-10-12 ENCOUNTER — Ambulatory Visit (INDEPENDENT_AMBULATORY_CARE_PROVIDER_SITE_OTHER): Payer: Managed Care, Other (non HMO) | Admitting: Nurse Practitioner

## 2020-10-12 ENCOUNTER — Encounter: Payer: Self-pay | Admitting: Nurse Practitioner

## 2020-10-12 VITALS — BP 114/72 | HR 78 | Temp 97.4°F | Ht 71.0 in | Wt 203.0 lb

## 2020-10-12 DIAGNOSIS — C259 Malignant neoplasm of pancreas, unspecified: Secondary | ICD-10-CM

## 2020-10-12 DIAGNOSIS — Z8739 Personal history of other diseases of the musculoskeletal system and connective tissue: Secondary | ICD-10-CM

## 2020-10-12 DIAGNOSIS — J338 Other polyp of sinus: Secondary | ICD-10-CM

## 2020-10-12 DIAGNOSIS — Z8619 Personal history of other infectious and parasitic diseases: Secondary | ICD-10-CM

## 2020-10-12 DIAGNOSIS — C787 Secondary malignant neoplasm of liver and intrahepatic bile duct: Secondary | ICD-10-CM

## 2020-10-12 DIAGNOSIS — D701 Agranulocytosis secondary to cancer chemotherapy: Secondary | ICD-10-CM

## 2020-10-12 DIAGNOSIS — J329 Chronic sinusitis, unspecified: Secondary | ICD-10-CM | POA: Diagnosis not present

## 2020-10-12 DIAGNOSIS — R300 Dysuria: Secondary | ICD-10-CM | POA: Diagnosis not present

## 2020-10-12 DIAGNOSIS — J302 Other seasonal allergic rhinitis: Secondary | ICD-10-CM | POA: Diagnosis not present

## 2020-10-12 DIAGNOSIS — T451X5A Adverse effect of antineoplastic and immunosuppressive drugs, initial encounter: Secondary | ICD-10-CM

## 2020-10-12 LAB — POCT URINALYSIS DIPSTICK
Bilirubin, UA: NEGATIVE
Blood, UA: NEGATIVE
Glucose, UA: NEGATIVE
Ketones, UA: NEGATIVE
Leukocytes, UA: NEGATIVE
Nitrite, UA: NEGATIVE
Protein, UA: NEGATIVE
Spec Grav, UA: 1.015 (ref 1.010–1.025)
Urobilinogen, UA: NEGATIVE E.U./dL — AB
pH, UA: 6 (ref 5.0–8.0)

## 2020-10-12 MED ORDER — FLUCONAZOLE 150 MG PO TABS: 150.0000 mg | ORAL_TABLET | Freq: Once | ORAL | 0 refills | Status: AC

## 2020-10-12 MED ORDER — LEVOFLOXACIN 500 MG PO TABS: 500.0000 mg | ORAL_TABLET | Freq: Every day | ORAL | 0 refills | Status: DC

## 2020-10-12 NOTE — Patient Instructions (Addendum)

## 2020-10-12 NOTE — Progress Notes (Signed)
Acute Office Visit  Subjective:    [name], male    DOB: 06-25-1965, 55 y.o.   MRN: [id number]  Chief Complaint  Patient presents with   Sinus pain       HPI Cyle is a 55 year old Caucasian male that presents with sinus congestion, left jaw pain, and left sinus tenderness. Onset of symptoms was approximately 3-weeks ago. He has a history of chronic sinusitis. Last prescribed course of Augmentin 07/30/20 for sinusitis. He has undergone previous endoscopic sinus surgery with multiple nasal polypectomies. He is currently being followed by ENT. He is performing daily neti pot sinus rinses with prescribed antibiotic irrigation. States his right nasal cavity is non-tender. He tells me experiences pain and tenderness to left frontal sinus cavity, left ear, and lower jaw a few hours after sinus irrigation. He is scheduled to follow-up with ENT in 53-month. Tally has a past medical history of chronic allergic rhinitis that is treated with Zyrtec 10 mg and Atrovent nasal spray.  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 He has chronic chemotherapy induced neutropenia. WBC 3.9 on 09/12/20. States he is uncircumcised; has a history of candidiasis with antibiotic treatment.   Past Medical History:  Diagnosis Date   Barrett's esophagus    Cervicalgia    Essential hypertension    GERD (gastroesophageal reflux disease)    History of COVID-19    05/2019   Mixed hyperlipidemia    Pancreatic carcinoma metastatic to liver [facility])    Primary malignant neoplasm of head of pancreas [facility])     Past Surgical History:  Procedure Laterality Date   LAPAROSCOPIC CHOLECYSTECTOMY  01/05/2015   NASAL POLYP SURGERY     REFRACTIVE SURGERY Bilateral 1988   WHIPPLE PROCEDURE  11/09/2015   Pylorus sparring    Family History  Problem Relation Age of Onset   Cancer Mother        breast   Hypertension Father    Hyperlipidemia Father    Cancer Father        prostate    Hypertension Sister     Social History   Socioeconomic History   Marital status: Married    Spouse name: Not on file   Number of children: 2   Years of education: Not on file   Highest education level: Not on file  Occupational History   Occupation: Retired    Comment: [employer]  Tobacco Use   Smoking status: Former    Pack years: 0.00    Types: Cigars    Quit date: 2005    Years since quitting: 17.4   Smokeless tobacco: Never  Substance and Sexual Activity   Alcohol use: Not Currently   Drug use: Never   Sexual activity: Yes    Partners: Female  Other Topics Concern   Not on file  Social History Narrative   Not on file   Social Determinants of Health   Financial Resource Strain: Not on file  Food Insecurity: Not on file  Transportation Needs: Not on file  Physical Activity: Not on file  Stress: Not on file  Social Connections: Not on file  Intimate Partner Violence: Not on file    Outpatient Medications Prior to Visit  Medication Sig Dispense Refill   acetaminophen (TYLENOL) 500 MG tablet Take 500 mg by mouth every 6 (six) hours as needed.     amLODipine (NORVASC) 10 MG tablet Take 1 tablet (10 mg total) by mouth daily. 90 tablet 1  budesonide (PULMICORT) 0.5 MG/2ML nebulizer solution Take by nebulization.     cetirizine (ZYRTEC) 10 MG tablet Take by mouth.     clonazePAM (KLONOPIN) 1 MG tablet Take by mouth.     colchicine 0.6 MG tablet Take 1 tablet (0.6 mg total) by mouth daily. 90 tablet 1   CREON 36000-114000 units CPEP capsule TAKE 3 capsules with meals. 1 after starting/2 when finished eating. Take 2 capsules with snack after starting to eat.     Cyanocobalamin (VITAMIN B-12 PO) Take by mouth.     cyclobenzaprine (FLEXERIL) 5 MG tablet Take 5 mg by mouth 3 (three) times daily as needed.     dicyclomine (BENTYL) 10 MG capsule Take 10 mg by mouth 3 (three) times daily as needed.     diphenhydrAMINE (SOMINEX) 25 MG tablet Take by mouth.     esomeprazole  (NEXIUM) 40 MG capsule Take 1 capsule (40 mg total) by mouth daily. 90 capsule 3   febuxostat (ULORIC) 40 MG tablet Take 1 tablet (40 mg total) by mouth daily. 90 tablet 1   gabapentin (NEURONTIN) 300 MG capsule Take by mouth.     gabapentin (NEURONTIN) 300 MG capsule Take 1 capsule by mouth at bedtime.     guaifenesin (HUMIBID E) 400 MG TABS tablet Take by mouth as needed.     ipratropium (ATROVENT) 0.06 % nasal spray Place into both nostrils.     ketoconazole (NIZORAL) 2 % cream Apply 1 application topically 2 (two) times daily. 15 g 0   linaclotide (LINZESS) 72 MCG capsule as needed.     lisinopril (ZESTRIL) 20 MG tablet Take 1 tablet (20 mg total) by mouth daily. 90 tablet 1   LORazepam (ATIVAN) 1 MG tablet Take 1 mg by mouth every 6 (six) hours as needed.     meloxicam (MOBIC) 7.5 MG tablet TAKE ONE TABLET BY MOUTH TWICE DAILY 60 tablet 11   Multiple Vitamin (MULTIVITAMIN) capsule Take 1 capsule by mouth daily.     Omega-3 1000 MG CAPS Take by mouth.     polyethylene glycol powder (GLYCOLAX/MIRALAX) 17 GM/SCOOP powder Take by mouth.     prochlorperazine (COMPAZINE) 10 MG tablet Take 10 mg by mouth every 6 (six) hours as needed.     Pyridoxine HCl (VITAMIN B-6 PO) Take by mouth.     Riboflavin (VITAMIN B-2 PO) Take by mouth.     Thiamine HCl (VITAMIN B-1 PO) Take by mouth.     TOBRAMYCIN SULFATE IN SALINE IV ADD 1ML OF MEDICATION ADD TO 250ML OF SALINE IN SALINE IRRIGATION BOTTLE; IRRIGATE SINUSES WITH 120ML THROUGH EACH NOSTRIL ONCE DAILY     traMADol (ULTRAM) 50 MG tablet Take by mouth.     amoxicillin-clavulanate (AUGMENTIN) 875-125 MG tablet Take 1 tablet by mouth 2 (two) times daily. 20 tablet 0   No facility-administered medications prior to visit.    Allergies  Allergen Reactions   Trazodone And Nefazodone     Somnolence. Cloudy feeling.     Review of Systems  Constitutional:  Negative for chills, fatigue and fever.  HENT:  Positive for congestion, ear pain (left ear),  postnasal drip and sinus pressure. Negative for sore throat.   Eyes: Negative.   Respiratory:  Negative for cough and shortness of breath.   Cardiovascular:  Negative for chest pain and leg swelling.  Gastrointestinal:  Negative for abdominal pain, constipation, diarrhea, nausea and vomiting.  Endocrine: Negative.   Genitourinary:  Positive for dysuria. Negative for frequency.  Musculoskeletal:  Negative for arthralgias and myalgias.  Skin: Negative.   Allergic/Immunologic: Positive for environmental allergies and immunocompromised state.  Neurological:  Negative for dizziness and headaches.  Hematological: Negative.   Psychiatric/Behavioral:  Negative for dysphoric mood. The patient is not nervous/anxious.       Objective:    Physical Exam Vitals reviewed.  Constitutional:      Appearance: Normal appearance.  HENT:     Head: Normocephalic.     Jaw: Tenderness (left lower jaw tenderness) present.     Right Ear: Tympanic membrane normal.     Left Ear: Tympanic membrane normal.     Nose: Congestion and rhinorrhea present.     Mouth/Throat:     Mouth: Mucous membranes are moist.     Pharynx: Posterior oropharyngeal erythema present.  Eyes:     Pupils: Pupils are equal, round, and reactive to light.  Cardiovascular:     Rate and Rhythm: Normal rate and regular rhythm.     Pulses: Normal pulses.     Heart sounds: Normal heart sounds.  Pulmonary:     Effort: Pulmonary effort is normal.     Breath sounds: Normal breath sounds.  Abdominal:     General: Bowel sounds are normal.     Palpations: Abdomen is soft.  Musculoskeletal:     Cervical back: No tenderness.  Lymphadenopathy:     Cervical: No cervical adenopathy.  Skin:    General: Skin is warm and dry.     Capillary Refill: Capillary refill takes less than 2 seconds.  Neurological:     General: No focal deficit present.     Mental Status: He is alert and oriented to person, place, and time.  Psychiatric:        Mood  and Affect: Mood normal.        Behavior: Behavior normal.    BP 114/72 (BP Location: Left Arm, Patient Position: Sitting)   Pulse 78   Temp (!) 97.4 F (36.3 C) (Temporal)   Ht 5\' 11"  (1.610 m)   Wt 203 lb (92.1 kg)   SpO2 96%   BMI 28.31 kg/m  Wt Readings from Last 3 Encounters:  10/12/20 203 lb (92.1 kg)  08/31/20 210 lb (95.3 kg)  07/30/20 203 lb (92.1 kg)    Health Maintenance Due  Topic Date Due   COVID-19 Vaccine (1) Never done   Pneumococcal Vaccine 51-39 Years old (1 - PCV) Never done   HIV Screening  Never done   Hepatitis C Screening  Never done   TETANUS/TDAP  Never done   Zoster Vaccines- Shingrix (1 of 2) Never done   COLONOSCOPY (Pts 45-39yrs Insurance coverage will need to be confirmed)  Never done     Lab Results  Component Value Date   CHOL 199 10/13/2019   Lab Results  Component Value Date   HDL 54 10/13/2019   Lab Results  Component Value Date   LDLCALC 126 (H) 10/13/2019   Lab Results  Component Value Date   TRIG 104 10/13/2019   Lab Results  Component Value Date   CHOLHDL 3.7 10/13/2019      Assessment & Plan:   1. Recurrent sinusitis - levofloxacin (LEVAQUIN) 500 MG tablet; Take 1 tablet (500 mg total) by mouth daily.  Dispense: 7 tablet; Refill: 0 -follow up with ENT as scheduled  2. History of candidiasis - fluconazole (DIFLUCAN) 150 MG tablet; Take 1 tablet (150 mg total) by mouth once for 1 dose.  Dispense: 1 tablet; Refill: 0  3. Pancreatic cancer metastasized to liver [facility]) -follow-up with oncology as scheduled  4. Dysuria - POCT urinalysis dipstick  5. History of TMJ disorder  -avoid chewing gum -Ibuprofen as needed  Recommend routine dental visit  Follow-up: as needed   I , Lauren Peterson [place] as a scribe for [employer], NP.,have documented all relevant documentation on the behalf of Rip Harbour, NP,as directed by  Rip Harbour, NP while in the presence of Rip Harbour, NP.   I, Rip Harbour, NP, have reviewed all documentation for this visit. The documentation on 10/13/20 for the exam, diagnosis, procedures, and orders are all accurate and complete.   Follow-up: as needed  Signed,  Jerrell Belfast, DNP

## 2020-10-17 ENCOUNTER — Other Ambulatory Visit: Payer: Self-pay | Admitting: Family Medicine

## 2020-10-23 NOTE — Progress Notes (Signed)
Acute Office Visit  Subjective:    [name], male    DOB: 02-15-1966, 55 y.o.   MRN: [id number]  CC: Left lower back pain   HPI Patient is in today for today for low back pain. Onset was 10-days-ago. Treatment includes Flexeril muscle relaxer and Ibuprofen. Pain radiates down posterior left leg.  Back Pain  He reports new onset back pain. There was not an injury that may have caused the pain. The most recent episode started about a week ago and is staying constant. The pain is located in the left sacroiliac areabelow the left knee. It is described as burning, is 9/10 in intensity, occurring intermittently.   Aggravating factors: standing Relieving factors: ice pack .  He has tried NSAIDs and Flexeril with little relief.   Associated symptoms: No abdominal pain No bowel incontinence  No chest pain No dysuria   No fever No headaches  No joint pains No pelvic pain  Yes weakness in leg  Yes tingling in lower extremities  No urinary incontinence No weight loss     Past Medical History:  Diagnosis Date   Barrett's esophagus    Cervicalgia    Essential hypertension    GERD (gastroesophageal reflux disease)    History of COVID-19    05/2019   Mixed hyperlipidemia    Pancreatic carcinoma metastatic to liver [facility])    Primary malignant neoplasm of head of pancreas [facility])     Past Surgical History:  Procedure Laterality Date   LAPAROSCOPIC CHOLECYSTECTOMY  01/05/2015   NASAL POLYP SURGERY     REFRACTIVE SURGERY Bilateral 1988   WHIPPLE PROCEDURE  11/09/2015   Pylorus sparring    Family History  Problem Relation Age of Onset   Cancer Mother        breast   Hypertension Father    Hyperlipidemia Father    Cancer Father        prostate   Hypertension Sister     Social History   Socioeconomic History   Marital status: Married    Spouse name: Not on file   Number of children: 2   Years of education: Not on file   Highest education level: Not on  file  Occupational History   Occupation: Retired    Comment: [employer]  Tobacco Use   Smoking status: Former    Pack years: 0.00    Types: Cigars    Quit date: 2005    Years since quitting: 17.4   Smokeless tobacco: Never  Substance and Sexual Activity   Alcohol use: Not Currently   Drug use: Never   Sexual activity: Yes    Partners: Female  Other Topics Concern   Not on file  Social History Narrative   Not on file   Social Determinants of Health   Financial Resource Strain: Not on file  Food Insecurity: Not on file  Transportation Needs: Not on file  Physical Activity: Not on file  Stress: Not on file  Social Connections: Not on file  Intimate Partner Violence: Not on file    Outpatient Medications Prior to Visit  Medication Sig Dispense Refill   acetaminophen (TYLENOL) 500 MG tablet Take 500 mg by mouth every 6 (six) hours as needed.     amLODipine (NORVASC) 10 MG tablet Take 1 tablet (10 mg total) by mouth daily. 90 tablet 1   budesonide (PULMICORT) 0.5 MG/2ML nebulizer solution Take by nebulization.     cetirizine (ZYRTEC) 10 MG tablet Take  by mouth.     clonazePAM (KLONOPIN) 1 MG tablet Take by mouth.     colchicine 0.6 MG tablet Take 1 tablet (0.6 mg total) by mouth daily. 90 tablet 1   CREON 36000-114000 units CPEP capsule TAKE 3 capsules with meals. 1 after starting/2 when finished eating. Take 2 capsules with snack after starting to eat.     Cyanocobalamin (VITAMIN B-12 PO) Take by mouth.     cyclobenzaprine (FLEXERIL) 5 MG tablet Take 5 mg by mouth 3 (three) times daily as needed.     dicyclomine (BENTYL) 10 MG capsule Take 10 mg by mouth 3 (three) times daily as needed.     diphenhydrAMINE (SOMINEX) 25 MG tablet Take by mouth.     esomeprazole (NEXIUM) 40 MG capsule Take 1 capsule (40 mg total) by mouth daily. 90 capsule 3   febuxostat (ULORIC) 40 MG tablet Take 1 tablet (40 mg total) by mouth daily. 90 tablet 1   gabapentin (NEURONTIN) 300 MG capsule Take  by mouth.     gabapentin (NEURONTIN) 300 MG capsule Take 1 capsule by mouth at bedtime.     guaifenesin (HUMIBID E) 400 MG TABS tablet Take by mouth as needed.     ipratropium (ATROVENT) 0.06 % nasal spray Place into both nostrils.     ketoconazole (NIZORAL) 2 % cream Apply 1 application topically 2 (two) times daily. 15 g 0   levofloxacin (LEVAQUIN) 500 MG tablet Take 1 tablet (500 mg total) by mouth daily. 7 tablet 0   linaclotide (LINZESS) 72 MCG capsule as needed.     lisinopril (ZESTRIL) 20 MG tablet Take 1 tablet (20 mg total) by mouth daily. 90 tablet 1   LORazepam (ATIVAN) 1 MG tablet Take 1 mg by mouth every 6 (six) hours as needed.     meloxicam (MOBIC) 7.5 MG tablet TAKE ONE TABLET BY MOUTH TWICE DAILY 60 tablet 11   Multiple Vitamin (MULTIVITAMIN) capsule Take 1 capsule by mouth daily.     Omega-3 1000 MG CAPS Take by mouth.     polyethylene glycol powder (GLYCOLAX/MIRALAX) 17 GM/SCOOP powder Take by mouth.     prochlorperazine (COMPAZINE) 10 MG tablet Take 10 mg by mouth every 6 (six) hours as needed.     Pyridoxine HCl (VITAMIN B-6 PO) Take by mouth.     Riboflavin (VITAMIN B-2 PO) Take by mouth.     Thiamine HCl (VITAMIN B-1 PO) Take by mouth.     TOBRAMYCIN SULFATE IN SALINE IV ADD 1ML OF MEDICATION ADD TO 250ML OF SALINE IN SALINE IRRIGATION BOTTLE; IRRIGATE SINUSES WITH 120ML THROUGH EACH NOSTRIL ONCE DAILY     traMADol (ULTRAM) 50 MG tablet Take by mouth.     No facility-administered medications prior to visit.    Allergies  Allergen Reactions   Trazodone And Nefazodone     Somnolence. Cloudy feeling.     Review of Systems  Constitutional:  Negative for appetite change, fatigue and unexpected weight change.  HENT:  Negative for congestion, ear pain, rhinorrhea, sinus pressure, sinus pain and tinnitus.   Eyes:  Negative for pain.  Respiratory:  Negative for cough and shortness of breath.   Cardiovascular:  Negative for chest pain, palpitations and leg swelling.   Gastrointestinal:  Negative for abdominal pain, constipation, diarrhea, nausea and vomiting.  Endocrine: Negative for cold intolerance, heat intolerance, polydipsia, polyphagia and polyuria.  Genitourinary:  Negative for dysuria, frequency and hematuria.  Musculoskeletal:  Negative for arthralgias, back pain, joint swelling and myalgias.  Skin:  Negative for rash.  Allergic/Immunologic: Negative for environmental allergies.  Neurological:  Negative for dizziness and headaches.  Hematological:  Negative for adenopathy.  Psychiatric/Behavioral:  Negative for decreased concentration and sleep disturbance. The patient is not nervous/anxious.       Objective:    Physical Exam Vitals reviewed.  Constitutional:      Appearance: Normal appearance.  Pulmonary:     Effort: Pulmonary effort is normal.  Musculoskeletal:        General: Tenderness present.     Cervical back: Normal.     Thoracic back: Normal.     Lumbar back: Spasms and tenderness present. Positive left straight leg raise test.  Skin:    General: Skin is warm and dry.     Capillary Refill: Capillary refill takes less than 2 seconds.  Neurological:     General: No focal deficit present.     Mental Status: He is alert and oriented to person, place, and time.  Psychiatric:        Mood and Affect: Mood normal.        Behavior: Behavior normal.   BP 110/72   Pulse 78   Temp (!) 97.5 F (36.4 C)   Resp 16   Ht 5\' 11"  (1.803 m)   Wt 223 lb (101.2 kg)   BMI 31.10 kg/m   Wt Readings from Last 3 Encounters:  10/12/20 203 lb (92.1 kg)  08/31/20 210 lb (95.3 kg)  07/30/20 203 lb (92.1 kg)    Health Maintenance Due  Topic Date Due   COVID-19 Vaccine (1) Never done   Pneumococcal Vaccine 43-69 Years old (1 - PCV) Never done   HIV Screening  Never done   Hepatitis C Screening  Never done   TETANUS/TDAP  Never done   Zoster Vaccines- Shingrix (1 of 2) Never done   COLONOSCOPY (Pts 45-20yrs Insurance coverage will need to  be confirmed)  Never done     Lab Results  Component Value Date   CHOL 199 10/13/2019   Lab Results  Component Value Date   HDL 54 10/13/2019   Lab Results  Component Value Date   LDLCALC 126 (H) 10/13/2019   Lab Results  Component Value Date   TRIG 104 10/13/2019   Lab Results  Component Value Date   CHOLHDL 3.7 10/13/2019       Assessment & Plan:   1. Sciatica of left side - cyclobenzaprine (FLEXERIL) 10 MG tablet; Take 1 tablet (10 mg total) by mouth 3 (three) times daily as needed for muscle spasms.  Dispense: 30 tablet; Refill: 0 - ibuprofen (ADVIL) 600 MG tablet; Take 1 tablet (600 mg total) by mouth every 8 (eight) hours as needed.  Dispense: 30 tablet; Refill: 1 - triamcinolone acetonide (KENALOG-40) injection 40 mg - ketorolac (TORADOL) injection 60 mg  2. Spasm of muscle of lower back - cyclobenzaprine (FLEXERIL) 10 MG tablet; Take 1 tablet (10 mg total) by mouth 3 (three) times daily as needed for muscle spasms.  Dispense: 30 tablet; Refill: 0   Kenalog 40 mg and Toradol 60 mg injection given for left lower back pain Take Flexeril 10 mg 3 times daily as needed for lower back muscle spasms Take Ibuprofen 600 mg as directed for back pain Perform lower back exercises Follow-up as needed   I spent minutes dedicated to the care of this patient on the date of this encounter to include face-to-face time with the patient, as well as: EMR and prescription medication  management.   Follow-up: PRN  An After Visit Summary was printed and given to the patient.  Rip Harbour, NP [place] [phone]

## 2020-10-24 ENCOUNTER — Ambulatory Visit (INDEPENDENT_AMBULATORY_CARE_PROVIDER_SITE_OTHER): Payer: Managed Care, Other (non HMO) | Admitting: Nurse Practitioner

## 2020-10-24 ENCOUNTER — Encounter: Payer: Self-pay | Admitting: Nurse Practitioner

## 2020-10-24 ENCOUNTER — Other Ambulatory Visit: Payer: Self-pay

## 2020-10-24 VITALS — BP 110/72 | HR 78 | Temp 97.5°F | Resp 16 | Ht 71.0 in | Wt 223.0 lb

## 2020-10-24 DIAGNOSIS — M5432 Sciatica, left side: Secondary | ICD-10-CM | POA: Diagnosis not present

## 2020-10-24 DIAGNOSIS — M62830 Muscle spasm of back: Secondary | ICD-10-CM | POA: Diagnosis not present

## 2020-10-24 MED ORDER — KETOROLAC TROMETHAMINE 60 MG/2ML IM SOLN
60.0000 mg | Freq: Once | INTRAMUSCULAR | Status: AC
  Administered 2020-10-24: 60 mg via INTRAMUSCULAR

## 2020-10-24 MED ORDER — CYCLOBENZAPRINE HCL 10 MG PO TABS: 10.0000 mg | ORAL_TABLET | Freq: Three times a day (TID) | ORAL | 0 refills | Status: DC | PRN

## 2020-10-24 MED ORDER — TRIAMCINOLONE ACETONIDE 40 MG/ML IJ SUSP
40.0000 mg | Freq: Once | INTRAMUSCULAR | Status: AC
  Administered 2020-10-24: 40 mg via INTRA_ARTICULAR

## 2020-10-24 MED ORDER — IBUPROFEN 600 MG PO TABS: 600.0000 mg | ORAL_TABLET | Freq: Three times a day (TID) | ORAL | 1 refills | Status: DC | PRN

## 2020-10-24 NOTE — Patient Instructions (Signed)
Sciatica  Sciatica is pain, weakness, tingling, or loss of feeling (numbness) along the sciatic nerve. The sciatic nerve starts in the lower back and goes down the back of each leg. Sciatica usually goes away on its own or with treatment. Sometimes, sciatica may come back (recur). What are the causes? This condition happens when the sciatic nerve is pinched or has pressure put on it. This may be the result of: A disk in between the bones of the spine bulging out too far (herniated disk). Changes in the spinal disks that occur with aging. A condition that affects a muscle in the butt. Extra bone growth near the sciatic nerve. A break (fracture) of the area between your hip bones (pelvis). Pregnancy. Tumor. This is rare. What increases the risk? You are more likely to develop this condition if you: Play sports that put pressure or stress on the spine. Have poor strength and ease of movement (flexibility). Have had a back injury in the past. Have had back surgery. Sit for long periods of time. Do activities that involve bending or lifting over and over again. Are very overweight (obese). What are the signs or symptoms? Symptoms can vary from mild to very bad. They may include: Any of these problems in the lower back, leg, hip, or butt: Mild tingling, loss of feeling, or dull aches. Burning sensations. Sharp pains. Loss of feeling in the back of the calf or the sole of the foot. Leg weakness. Very bad back pain that makes it hard to move. These symptoms may get worse when you cough, sneeze, or laugh. They may alsoget worse when you sit or stand for long periods of time. How is this treated? This condition often gets better without any treatment. However, treatment may include: Changing or cutting back on physical activity when you have pain. Doing exercises and stretching. Putting ice or heat on the affected area. Medicines that help: To relieve pain and swelling. To relax your  muscles. Shots (injections) of medicines that help to relieve pain, irritation, and swelling. Surgery. Follow these instructions at home: Medicines Take over-the-counter and prescription medicines only as told by your doctor. Ask your doctor if the medicine prescribed to you: Requires you to avoid driving or using heavy machinery. Can cause trouble pooping (constipation). You may need to take these steps to prevent or treat trouble pooping: Drink enough fluids to keep your pee (urine) pale yellow. Take over-the-counter or prescription medicines. Eat foods that are high in fiber. These include beans, whole grains, and fresh fruits and vegetables. Limit foods that are high in fat and sugar. These include fried or sweet foods. Managing pain     If told, put ice on the affected area. Put ice in a plastic bag. Place a towel between your skin and the bag. Leave the ice on for 20 minutes, 2-3 times a day. If told, put heat on the affected area. Use the heat source that your doctor tells you to use, such as a moist heat pack or a heating pad. Place a towel between your skin and the heat source. Leave the heat on for 20-30 minutes. Remove the heat if your skin turns bright red. This is very important if you are unable to feel pain, heat, or cold. You may have a greater risk of getting burned. Activity  Return to your normal activities as told by your doctor. Ask your doctor what activities are safe for you. Avoid activities that make your symptoms worse. Take short  rests during the day. When you rest for a long time, do some physical activity or stretching between periods of rest. Avoid sitting for a long time without moving. Get up and move around at least one time each hour. Exercise and stretch regularly, as told by your doctor. Do not lift anything that is heavier than 10 lb (4.5 kg) while you have symptoms of sciatica. Avoid lifting heavy things even when you do not have  symptoms. Avoid lifting heavy things over and over. When you lift objects, always lift in a way that is safe for your body. To do this, you should: Bend your knees. Keep the object close to your body. Avoid twisting.  General instructions Stay at a healthy weight. Wear comfortable shoes that support your feet. Avoid wearing high heels. Avoid sleeping on a mattress that is too soft or too hard. You might have less pain if you sleep on a mattress that is firm enough to support your back. Keep all follow-up visits as told by your doctor. This is important. Contact a doctor if: You have pain that: Wakes you up when you are sleeping. Gets worse when you lie down. Is worse than the pain you have had in the past. Lasts longer than 4 weeks. You lose weight without trying. Get help right away if: You cannot control when you pee (urinate) or poop (have a bowel movement). You have weakness in any of these areas and it gets worse: Lower back. The area between your hip bones. Butt. Legs. You have redness or swelling of your back. You have a burning feeling when you pee. Summary Sciatica is pain, weakness, tingling, or loss of feeling (numbness) along the sciatic nerve. This condition happens when the sciatic nerve is pinched or has pressure put on it. Sciatica can cause pain, tingling, or loss of feeling (numbness) in the lower back, legs, hips, and butt. Treatment often includes rest, exercise, medicines, and putting ice or heat on the affected area. This information is not intended to replace advice given to you by your health care provider. Make sure you discuss any questions you have with your healthcare provider. Document Revised: 05/10/2018 Document Reviewed: 05/10/2018 Elsevier Patient Education  2022 [place]. Sciatica Rehab Ask your health care provider which exercises are safe for you. Do exercises exactly as told by your health care provider and adjust them as directed. It is  normal to feel mild stretching, pulling, tightness, or discomfort as you do these exercises. Stop right away if you feel sudden pain or your pain gets worse. Do not begin these exercises until told by your health care provider. Stretching and range-of-motion exercises These exercises warm up your muscles and joints and improve the movement and flexibility of your hips and back. These exercises also help to relieve pain,numbness, and tingling. Sciatic nerve glide Sit in a chair with your head facing down toward your chest. Place your hands behind your back. Let your shoulders slump forward. Slowly straighten one of your legs while you tilt your head back as if you are looking toward the ceiling. Only straighten your leg as far as you can without making your symptoms worse. Hold this position for __________ seconds. Slowly return to the starting position. Repeat with your other leg. Repeat __________ times. Complete this exercise __________ times a day. Knee to chest with hip adduction and internal rotation  Lie on your back on a firm surface with both legs straight. Bend one of your knees and move  it up toward your chest until you feel a gentle stretch in your lower back and buttock. Then, move your knee toward the shoulder that is on the opposite side from your leg. This is hip adduction and internal rotation. Hold your leg in this position by holding on to the front of your knee. Hold this position for __________ seconds. Slowly return to the starting position. Repeat with your other leg. Repeat __________ times. Complete this exercise __________ times a day. Prone extension on elbows  Lie on your abdomen on a firm surface. A bed may be too soft for this exercise. Prop yourself up on your elbows. Use your arms to help lift your chest up until you feel a gentle stretch in your abdomen and your lower back. This will place some of your body weight on your elbows. If this is uncomfortable, try  stacking pillows under your chest. Your hips should stay down, against the surface that you are lying on. Keep your hip and back muscles relaxed. Hold this position for __________ seconds. Slowly relax your upper body and return to the starting position. Repeat __________ times. Complete this exercise __________ times a day. Strengthening exercises These exercises build strength and endurance in your back. Endurance is theability to use your muscles for a long time, even after they get tired. Pelvic tilt This exercise strengthens the muscles that lie deep in the abdomen. Lie on your back on a firm surface. Bend your knees and keep your feet flat on the floor. Tense your abdominal muscles. Tip your pelvis up toward the ceiling and flatten your lower back into the floor. To help with this exercise, you may place a small towel under your lower back and try to push your back into the towel. Hold this position for __________ seconds. Let your muscles relax completely before you repeat this exercise. Repeat __________ times. Complete this exercise __________ times a day. Alternating arm and leg raises  Get on your hands and knees on a firm surface. If you are on a hard floor, you may want to use padding, such as an exercise mat, to cushion your knees. Line up your arms and legs. Your hands should be directly below your shoulders, and your knees should be directly below your hips. Lift your left leg behind you. At the same time, raise your right arm and straighten it in front of you. Do not lift your leg higher than your hip. Do not lift your arm higher than your shoulder. Keep your abdominal and back muscles tight. Keep your hips facing the ground. Do not arch your back. Keep your balance carefully, and do not hold your breath. Hold this position for __________ seconds. Slowly return to the starting position. Repeat with your right leg and your left arm. Repeat __________ times. Complete this  exercise __________ times a day. Posture and body mechanics Good posture and healthy body mechanics can help to relieve stress in your body's tissues and joints. Body mechanics refers to the movements and positions of your body while you do your daily activities. Posture is part of body mechanics. Good posture means: Your spine is in its natural S-curve position (neutral). Your shoulders are pulled back slightly. Your head is not tipped forward. Follow these guidelines to improve your posture and body mechanics in youreveryday activities. Standing  When standing, keep your spine neutral and your feet about hip width apart. Keep a slight bend in your knees. Your ears, shoulders, and hips should line up.  When you do a task in which you stand in one place for a long time, place one foot up on a stable object that is 2-4 inches (5-10 cm) high, such as a footstool. This helps keep your spine neutral.  Sitting  When sitting, keep your spine neutral and keep your feet flat on the floor. Use a footrest, if necessary, and keep your thighs parallel to the floor. Avoid rounding your shoulders, and avoid tilting your head forward. When working at a desk or a computer, keep your desk at a height where your hands are slightly lower than your elbows. Slide your chair under your desk so you are close enough to maintain good posture. When working at a computer, place your monitor at a height where you are looking straight ahead and you do not have to tilt your head forward or downward to look at the screen.  Resting When lying down and resting, avoid positions that are most painful for you. If you have pain with activities such as sitting, bending, stooping, or squatting, lie in a position in which your body does not bend very much. For example, avoid curling up on your side with your arms and knees near your chest (fetal position). If you have pain with activities such as standing for a long time or reaching  with your arms, lie with your spine in a neutral position and bend your knees slightly. Try the following positions: Lying on your side with a pillow between your knees. Lying on your back with a pillow under your knees. Lifting  When lifting objects, keep your feet at least shoulder width apart and tighten your abdominal muscles. Bend your knees and hips and keep your spine neutral. It is important to lift using the strength of your legs, not your back. Do not lock your knees straight out. Always ask for help to lift heavy or awkward objects.  This information is not intended to replace advice given to you by your health care provider. Make sure you discuss any questions you have with your healthcare provider. Document Revised: 08/13/2018 Document Reviewed: 05/13/2018 Elsevier Patient Education  [place].

## 2021-02-19 ENCOUNTER — Other Ambulatory Visit: Payer: Self-pay | Admitting: Family Medicine

## 2021-02-24 ENCOUNTER — Other Ambulatory Visit: Payer: Self-pay | Admitting: Family Medicine

## 2021-02-25 NOTE — Telephone Encounter (Signed)
Refill sent to pharmacy.   

## 2021-04-15 ENCOUNTER — Other Ambulatory Visit: Payer: Self-pay | Admitting: Legal Medicine

## 2021-04-23 ENCOUNTER — Other Ambulatory Visit: Payer: Self-pay | Admitting: Family Medicine

## 2021-07-08 DIAGNOSIS — K769 Liver disease, unspecified: Secondary | ICD-10-CM | POA: Diagnosis not present

## 2021-07-08 DIAGNOSIS — C787 Secondary malignant neoplasm of liver and intrahepatic bile duct: Secondary | ICD-10-CM | POA: Diagnosis not present

## 2021-07-08 DIAGNOSIS — C250 Malignant neoplasm of head of pancreas: Secondary | ICD-10-CM | POA: Diagnosis not present

## 2021-07-08 DIAGNOSIS — D134 Benign neoplasm of liver: Secondary | ICD-10-CM | POA: Diagnosis not present

## 2021-07-08 DIAGNOSIS — C801 Malignant (primary) neoplasm, unspecified: Secondary | ICD-10-CM | POA: Diagnosis not present

## 2021-07-12 DIAGNOSIS — M5030 Other cervical disc degeneration, unspecified cervical region: Secondary | ICD-10-CM | POA: Diagnosis not present

## 2021-07-12 DIAGNOSIS — C787 Secondary malignant neoplasm of liver and intrahepatic bile duct: Secondary | ICD-10-CM | POA: Diagnosis not present

## 2021-07-12 DIAGNOSIS — C250 Malignant neoplasm of head of pancreas: Secondary | ICD-10-CM | POA: Diagnosis not present

## 2021-07-12 DIAGNOSIS — C259 Malignant neoplasm of pancreas, unspecified: Secondary | ICD-10-CM | POA: Diagnosis not present

## 2021-07-12 DIAGNOSIS — K8689 Other specified diseases of pancreas: Secondary | ICD-10-CM | POA: Diagnosis not present

## 2021-07-12 DIAGNOSIS — G629 Polyneuropathy, unspecified: Secondary | ICD-10-CM | POA: Diagnosis not present

## 2021-07-12 DIAGNOSIS — R197 Diarrhea, unspecified: Secondary | ICD-10-CM | POA: Diagnosis not present

## 2021-07-12 DIAGNOSIS — M47812 Spondylosis without myelopathy or radiculopathy, cervical region: Secondary | ICD-10-CM | POA: Diagnosis not present

## 2021-07-12 DIAGNOSIS — R112 Nausea with vomiting, unspecified: Secondary | ICD-10-CM | POA: Diagnosis not present

## 2021-07-17 DIAGNOSIS — C250 Malignant neoplasm of head of pancreas: Secondary | ICD-10-CM | POA: Diagnosis not present

## 2021-07-22 DIAGNOSIS — C787 Secondary malignant neoplasm of liver and intrahepatic bile duct: Secondary | ICD-10-CM | POA: Diagnosis not present

## 2021-07-22 DIAGNOSIS — C259 Malignant neoplasm of pancreas, unspecified: Secondary | ICD-10-CM | POA: Diagnosis not present

## 2021-07-31 DIAGNOSIS — R932 Abnormal findings on diagnostic imaging of liver and biliary tract: Secondary | ICD-10-CM | POA: Diagnosis not present

## 2021-07-31 DIAGNOSIS — Z9889 Other specified postprocedural states: Secondary | ICD-10-CM | POA: Diagnosis not present

## 2021-07-31 DIAGNOSIS — C787 Secondary malignant neoplasm of liver and intrahepatic bile duct: Secondary | ICD-10-CM | POA: Diagnosis not present

## 2021-07-31 DIAGNOSIS — I10 Essential (primary) hypertension: Secondary | ICD-10-CM | POA: Diagnosis not present

## 2021-07-31 DIAGNOSIS — K219 Gastro-esophageal reflux disease without esophagitis: Secondary | ICD-10-CM | POA: Diagnosis not present

## 2021-07-31 DIAGNOSIS — C259 Malignant neoplasm of pancreas, unspecified: Secondary | ICD-10-CM | POA: Diagnosis not present

## 2021-07-31 DIAGNOSIS — C250 Malignant neoplasm of head of pancreas: Secondary | ICD-10-CM | POA: Diagnosis not present

## 2021-08-01 DIAGNOSIS — C61 Malignant neoplasm of prostate: Secondary | ICD-10-CM | POA: Diagnosis not present

## 2021-08-01 DIAGNOSIS — Z9889 Other specified postprocedural states: Secondary | ICD-10-CM | POA: Diagnosis not present

## 2021-08-05 DIAGNOSIS — E860 Dehydration: Secondary | ICD-10-CM | POA: Diagnosis not present

## 2021-08-05 DIAGNOSIS — R0602 Shortness of breath: Secondary | ICD-10-CM | POA: Diagnosis not present

## 2021-08-05 DIAGNOSIS — R051 Acute cough: Secondary | ICD-10-CM | POA: Diagnosis not present

## 2021-08-05 DIAGNOSIS — R1011 Right upper quadrant pain: Secondary | ICD-10-CM | POA: Diagnosis not present

## 2021-08-05 DIAGNOSIS — M25511 Pain in right shoulder: Secondary | ICD-10-CM | POA: Diagnosis not present

## 2021-08-05 DIAGNOSIS — R058 Other specified cough: Secondary | ICD-10-CM | POA: Diagnosis not present

## 2021-08-05 DIAGNOSIS — C787 Secondary malignant neoplasm of liver and intrahepatic bile duct: Secondary | ICD-10-CM | POA: Diagnosis not present

## 2021-08-05 DIAGNOSIS — Z79899 Other long term (current) drug therapy: Secondary | ICD-10-CM | POA: Diagnosis not present

## 2021-08-05 DIAGNOSIS — Z20822 Contact with and (suspected) exposure to covid-19: Secondary | ICD-10-CM | POA: Diagnosis not present

## 2021-08-05 DIAGNOSIS — C250 Malignant neoplasm of head of pancreas: Secondary | ICD-10-CM | POA: Diagnosis not present

## 2021-08-05 DIAGNOSIS — R6883 Chills (without fever): Secondary | ICD-10-CM | POA: Diagnosis not present

## 2021-08-06 DIAGNOSIS — J90 Pleural effusion, not elsewhere classified: Secondary | ICD-10-CM | POA: Diagnosis not present

## 2021-08-06 DIAGNOSIS — R079 Chest pain, unspecified: Secondary | ICD-10-CM | POA: Diagnosis not present

## 2021-08-06 DIAGNOSIS — C250 Malignant neoplasm of head of pancreas: Secondary | ICD-10-CM | POA: Diagnosis not present

## 2021-08-06 DIAGNOSIS — C787 Secondary malignant neoplasm of liver and intrahepatic bile duct: Secondary | ICD-10-CM | POA: Diagnosis not present

## 2021-08-06 DIAGNOSIS — R918 Other nonspecific abnormal finding of lung field: Secondary | ICD-10-CM | POA: Diagnosis not present

## 2021-08-06 DIAGNOSIS — Z9889 Other specified postprocedural states: Secondary | ICD-10-CM | POA: Diagnosis not present

## 2021-08-06 DIAGNOSIS — R1011 Right upper quadrant pain: Secondary | ICD-10-CM | POA: Diagnosis not present

## 2021-08-06 DIAGNOSIS — G893 Neoplasm related pain (acute) (chronic): Secondary | ICD-10-CM | POA: Diagnosis not present

## 2021-08-06 DIAGNOSIS — Z87891 Personal history of nicotine dependence: Secondary | ICD-10-CM | POA: Diagnosis not present

## 2021-08-13 ENCOUNTER — Other Ambulatory Visit: Payer: Self-pay | Admitting: Family Medicine

## 2021-08-13 NOTE — Telephone Encounter (Signed)
Refill sent to pharmacy.   

## 2021-08-23 ENCOUNTER — Ambulatory Visit (INDEPENDENT_AMBULATORY_CARE_PROVIDER_SITE_OTHER): Payer: HMO | Admitting: Family Medicine

## 2021-08-23 VITALS — BP 106/70 | HR 80 | Temp 97.3°F | Resp 18 | Ht 71.0 in | Wt 200.0 lb

## 2021-08-23 DIAGNOSIS — I10 Essential (primary) hypertension: Secondary | ICD-10-CM | POA: Diagnosis not present

## 2021-08-23 DIAGNOSIS — K769 Liver disease, unspecified: Secondary | ICD-10-CM | POA: Diagnosis not present

## 2021-08-23 DIAGNOSIS — C787 Secondary malignant neoplasm of liver and intrahepatic bile duct: Secondary | ICD-10-CM

## 2021-08-23 DIAGNOSIS — K5904 Chronic idiopathic constipation: Secondary | ICD-10-CM

## 2021-08-23 DIAGNOSIS — K8689 Other specified diseases of pancreas: Secondary | ICD-10-CM | POA: Diagnosis not present

## 2021-08-23 DIAGNOSIS — C259 Malignant neoplasm of pancreas, unspecified: Secondary | ICD-10-CM | POA: Diagnosis not present

## 2021-08-23 DIAGNOSIS — K2100 Gastro-esophageal reflux disease with esophagitis, without bleeding: Secondary | ICD-10-CM | POA: Diagnosis not present

## 2021-08-23 MED ORDER — LOSARTAN POTASSIUM 25 MG PO TABS: 25.0000 mg | ORAL_TABLET | Freq: Every day | ORAL | 0 refills | Status: DC

## 2021-08-23 NOTE — Progress Notes (Signed)
? ?Subjective:  ?[name], male    DOB: 11-Apr-1966  Age: 56 y.o. MRN: [id number] ? ?Chief Complaint  ?Patient presents with  ? Hypertension  ? ? 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 Patient had liver ablation of most recent metastatic lesion 08/01/2021.  Patient is very tired. His fatigue comes and goes.  ?Patient received some oxycodone a couple of weeks ago, but had severe itching. Has had hydrocodone previously and did okay in the past. His doctor gave him tramadol and he tolerated it. Not needing any more. Weaned off last week. Scheduled for MRI of Liver in June 2023. Not taking any more chemotherapy now.  ? ?Pancreatic insufficiency: Dxd 2018. On creon 3 - 4 capsules with meals and two capsules with snacks.  ?Stays constipated. Trial on Linzess during chemotherapy which helped.  ? ? ?Current Outpatient Medications on File Prior to Visit  ?Medication Sig Dispense Refill  ? acetaminophen (TYLENOL) 500 MG tablet Take 500 mg by mouth every 6 (six) hours as needed.    ? amLODipine (NORVASC) 10 MG tablet Take 1 tablet (10 mg total) by mouth daily. 90 tablet 1  ? clonazePAM (KLONOPIN) 1 MG tablet Take by mouth.    ? CREON 36000-114000 units CPEP capsule TAKE 3 capsules with meals. 1 after starting/2 when finished eating. Take 2 capsules with snack after starting to eat.    ? dicyclomine (BENTYL) 10 MG capsule Take 10 mg by mouth 3 (three) times daily as needed.    ? esomeprazole (NEXIUM) 40 MG capsule Take 1 capsule (40 mg total) by mouth daily. 90 capsule 3  ? gabapentin (NEURONTIN) 300 MG capsule Take 1 capsule by mouth at bedtime.    ? guaifenesin (HUMIBID E) 400 MG TABS tablet Take by mouth as needed.    ? LORazepam (ATIVAN) 1 MG tablet Take 1 mg by mouth every 6 (six) hours as needed.    ? meloxicam (MOBIC) 7.5 MG tablet TAKE ONE TABLET BY MOUTH TWICE DAILY 60 tablet 11  ? Multiple Vitamin (MULTIVITAMIN) capsule Take 1 capsule by mouth daily.    ? Omega-3 1000 MG CAPS Take by  mouth.    ? polyethylene glycol powder (GLYCOLAX/MIRALAX) 17 GM/SCOOP powder Take by mouth.    ? Pyridoxine HCl (VITAMIN B-6 PO) Take by mouth.    ? tamsulosin (FLOMAX) 0.4 MG CAPS capsule Take 1 capsule (0.4 mg total) by mouth daily for 30 days. 30 capsule 1  ? Thiamine HCl (VITAMIN B-1 PO) Take by mouth.    ? traMADol (ULTRAM) 50 MG tablet Take by mouth.    ? cetirizine (ZYRTEC) 10 MG tablet Take by mouth.    ? diphenhydrAMINE (SOMINEX) 25 MG tablet Take by mouth.    ? ibuprofen (ADVIL) 600 MG tablet Take 1 tablet (600 mg total) by mouth every 8 (eight) hours as needed. (Patient not taking: Reported on 08/23/2021) 30 tablet 1  ? linaclotide (LINZESS) 72 MCG capsule as needed. (Patient not taking: Reported on 08/23/2021)    ? methocarbamol (ROBAXIN) 500 MG tablet Take 500 mg by mouth 2 (two) times daily.    ? prochlorperazine (COMPAZINE) 10 MG tablet Take 10 mg by mouth every 6 (six) hours as needed. (Patient not taking: Reported on 08/23/2021)    ? ?No current facility-administered medications on file prior to visit.  ? ?Past Medical History:  ?Diagnosis Date  ? Barrett's esophagus   ? Cervicalgia   ? Essential hypertension   ? GERD (gastroesophageal  reflux disease)   ? History of COVID-19   ? 05/2019  ? Mixed hyperlipidemia   ? Pancreatic carcinoma metastatic to liver [facility])   ? Primary malignant neoplasm of head of pancreas ([place])   ? ?Past Surgical History:  ?Procedure Laterality Date  ? LAPAROSCOPIC CHOLECYSTECTOMY  01/05/2015  ? NASAL POLYP SURGERY    ? REFRACTIVE SURGERY Bilateral 1988  ? WHIPPLE PROCEDURE  11/09/2015  ? Pylorus sparring  ?  ?Family History  ?Problem Relation Age of Onset  ? Cancer Mother   ?     breast  ? Hypertension Father   ? Hyperlipidemia Father   ? Cancer Father   ?     prostate  ? Hypertension Sister   ? ?Social History  ? ?Socioeconomic History  ? Marital status: Married  ?  Spouse name: Not on file  ? Number of children: 2  ? Years of education: Not on file  ? Highest education level:  Not on file  ?Occupational History  ? Occupation: Retired  ?  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 Smoking status: Former  ?  Types: Cigars  ?  Quit date: 2005  ?  Years since quitting: 18.3  ? Smokeless tobacco: Never  ?Substance and Sexual Activity  ? Alcohol use: Not Currently  ? Drug use: Never  ? Sexual activity: Yes  ?  Partners: Female  ?Other Topics Concern  ? Not on file  ?Social History Narrative  ? Not on file  ? ?Social Determinants of Health  ? ?Financial Resource Strain: Not on file  ?Food Insecurity: Not on file  ?Transportation Needs: Not on file  ?Physical Activity: Not on file  ?Stress: Not on file  ?Social Connections: Not on file  ? ? ?Review of Systems  ?Constitutional:  Positive for fatigue. Negative for chills and fever.  ?HENT:  Positive for congestion.   ?Respiratory:  Positive for cough and shortness of breath.   ?Gastrointestinal:  Positive for abdominal pain and constipation. Negative for diarrhea and nausea.  ?Genitourinary:  Negative for dysuria.  ?Musculoskeletal:  Positive for back pain.  ?Neurological:  Positive for dizziness and numbness (hands and feet).  ?Psychiatric/Behavioral:  Negative for dysphoric mood. The patient is not nervous/anxious.   ? ? ?Objective:  ?BP 106/70   Pulse 80   Temp (!) 97.3 ?F (36.3 ?C)   Resp 18   Ht '5\' 11"'$  (1.803 m)   Wt 200 lb (90.7 kg)   BMI 27.89 kg/m?  ? ? ?  08/23/2021  ? 11:00 AM 10/24/2020  ?  7:50 AM 10/12/2020  ? 11:06 AM  ?BP/Weight  ?Systolic BP 481 856 314  ?Diastolic BP 70 72 72  ?Wt. (Lbs) 200 223 203  ?BMI 27.89 kg/m2 31.1 kg/m2 28.31 kg/m2  ? ? ?Physical Exam ?Vitals reviewed.  ?Constitutional:   ?   Appearance: Normal appearance.  ?Neck:  ?   Vascular: No carotid bruit.  ?Cardiovascular:  ?   Rate and Rhythm: Normal rate and regular rhythm.  ?   Heart sounds: Normal heart sounds.  ?Pulmonary:  ?   Effort: Pulmonary effort is normal.  ?   Breath sounds: Normal breath sounds. No wheezing, rhonchi or rales.  ?Abdominal:  ?   General:  Bowel sounds are normal.  ?   Palpations: Abdomen is soft.  ?   Tenderness: There is no abdominal tenderness.  ?Neurological:  ?   Mental Status: He is alert and oriented to person, place, and time.  ?  Psychiatric:     ?   Mood and Affect: Mood normal.     ?   Behavior: Behavior normal.  ? ? ?Diabetic Foot Exam - Simple   ?No data filed ?  ?  ? ?Lab Results  ?Component Value Date  ? WBC 3.6 08/26/2021  ? HGB 14.6 08/26/2021  ? HCT 42.5 08/26/2021  ? PLT 196 08/26/2021  ? GLUCOSE 96 08/26/2021  ? CHOL 171 08/26/2021  ? TRIG 102 08/26/2021  ? HDL 56 08/26/2021  ? [place] 97 08/26/2021  ? ALT 23 08/26/2021  ? AST 24 08/26/2021  ? NA 137 08/26/2021  ? K 4.6 08/26/2021  ? CL 99 08/26/2021  ? CREATININE 0.92 08/26/2021  ? BUN 11 08/26/2021  ? CO2 24 08/26/2021  ? ? ? ? ?Assessment & Plan:  ? ?Problem List Items Addressed This Visit   ? ?  ? Cardiovascular and Mediastinum  ? Hypertension  ?  Well controlled. Continue amlodipine 10 mg once daily. ?Discontinue lisinopril 20 mg daily. Start on losartan 25 mg daily.  ? ?  ?  ? Relevant Medications  ? losartan (COZAAR) 25 MG tablet  ?  ? Digestive  ? Pancreatic cancer metastasized to liver [facility]) - Primary  ?  Management per specialist.  Oncology.  ? ? ?  ?  ? Chronic idiopathic constipation  ?  Continue linzess.  ? ?  ?  ? Pancreatic insufficiency  ?  Continue creon. ? ?  ?  ? Gastroesophageal reflux disease with esophagitis  ?  Continue nexium.  ? ?  ?  ? RESOLVED: Liver lesion  ?. ? ?Meds ordered this encounter  ?Medications  ? losartan (COZAAR) 25 MG tablet  ?  Sig: Take 1 tablet (25 mg total) by mouth daily.  ?  Dispense:  90 tablet  ?  Refill:  0  ? ? ?No orders of the defined types were placed in this encounter. ?  ? ?Follow-up: No follow-ups on file. ? ?An After Visit Summary was printed and given to the patient. ? ?Rochel Brome, MD ?[place] ?[phone] ?

## 2021-08-26 ENCOUNTER — Other Ambulatory Visit: Payer: Self-pay

## 2021-08-26 ENCOUNTER — Other Ambulatory Visit (INDEPENDENT_AMBULATORY_CARE_PROVIDER_SITE_OTHER): Payer: HMO

## 2021-08-26 ENCOUNTER — Other Ambulatory Visit: Payer: HMO

## 2021-08-26 DIAGNOSIS — M545 Low back pain, unspecified: Secondary | ICD-10-CM

## 2021-08-26 DIAGNOSIS — I10 Essential (primary) hypertension: Secondary | ICD-10-CM

## 2021-08-26 LAB — POCT URINALYSIS DIPSTICK
Bilirubin, UA: NEGATIVE
Blood, UA: NEGATIVE
Glucose, UA: NEGATIVE
Ketones, UA: NEGATIVE
Leukocytes, UA: NEGATIVE
Nitrite, UA: NEGATIVE
Protein, UA: NEGATIVE
Spec Grav, UA: 1.02 (ref 1.010–1.025)
Urobilinogen, UA: 0.2 E.U./dL
pH, UA: 6 (ref 5.0–8.0)

## 2021-08-27 LAB — CBC WITH DIFFERENTIAL/PLATELET
Basophils Absolute: 0.1 10*3/uL (ref 0.0–0.2)
Basos: 1 %
EOS (ABSOLUTE): 0.1 10*3/uL (ref 0.0–0.4)
Eos: 3 %
Hematocrit: 42.5 % (ref 37.5–51.0)
Hemoglobin: 14.6 g/dL (ref 13.0–17.7)
Immature Grans (Abs): 0 10*3/uL (ref 0.0–0.1)
Immature Granulocytes: 0 %
Lymphocytes Absolute: 0.9 10*3/uL (ref 0.7–3.1)
Lymphs: 23 %
MCH: 31.1 pg (ref 26.6–33.0)
MCHC: 34.4 g/dL (ref 31.5–35.7)
MCV: 90 fL (ref 79–97)
Monocytes Absolute: 0.4 10*3/uL (ref 0.1–0.9)
Monocytes: 12 %
Neutrophils Absolute: 2.2 10*3/uL (ref 1.4–7.0)
Neutrophils: 61 %
Platelets: 196 10*3/uL (ref 150–450)
RBC: 4.7 x10E6/uL (ref 4.14–5.80)
RDW: 12.6 % (ref 11.6–15.4)
WBC: 3.6 10*3/uL (ref 3.4–10.8)

## 2021-08-27 LAB — COMPREHENSIVE METABOLIC PANEL
ALT: 23 IU/L (ref 0–44)
AST: 24 IU/L (ref 0–40)
Albumin/Globulin Ratio: 2 (ref 1.2–2.2)
Albumin: 4.4 g/dL (ref 3.8–4.9)
Alkaline Phosphatase: 78 IU/L (ref 44–121)
BUN/Creatinine Ratio: 12 (ref 9–20)
BUN: 11 mg/dL (ref 6–24)
Bilirubin Total: 0.4 mg/dL (ref 0.0–1.2)
CO2: 24 mmol/L (ref 20–29)
Calcium: 9.6 mg/dL (ref 8.7–10.2)
Chloride: 99 mmol/L (ref 96–106)
Creatinine, Ser: 0.92 mg/dL (ref 0.76–1.27)
Globulin, Total: 2.2 g/dL (ref 1.5–4.5)
Glucose: 96 mg/dL (ref 70–99)
Potassium: 4.6 mmol/L (ref 3.5–5.2)
Sodium: 137 mmol/L (ref 134–144)
Total Protein: 6.6 g/dL (ref 6.0–8.5)
eGFR: 96 mL/min/{1.73_m2} (ref >59)

## 2021-08-27 LAB — LIPID PANEL
Chol/HDL Ratio: 3.1 ratio (ref 0.0–5.0)
Cholesterol, Total: 171 mg/dL (ref 100–199)
HDL: 56 mg/dL (ref >39)
LDL Chol Calc (NIH): 97 mg/dL (ref 0–99)
Triglycerides: 102 mg/dL (ref 0–149)
VLDL Cholesterol Cal: 18 mg/dL (ref 5–40)

## 2021-08-27 LAB — CARDIOVASCULAR RISK ASSESSMENT

## 2021-08-28 DIAGNOSIS — K7689 Other specified diseases of liver: Secondary | ICD-10-CM | POA: Diagnosis not present

## 2021-08-28 DIAGNOSIS — C787 Secondary malignant neoplasm of liver and intrahepatic bile duct: Secondary | ICD-10-CM | POA: Diagnosis not present

## 2021-08-28 DIAGNOSIS — G629 Polyneuropathy, unspecified: Secondary | ICD-10-CM | POA: Diagnosis not present

## 2021-08-28 DIAGNOSIS — R160 Hepatomegaly, not elsewhere classified: Secondary | ICD-10-CM | POA: Diagnosis not present

## 2021-08-28 DIAGNOSIS — M109 Gout, unspecified: Secondary | ICD-10-CM | POA: Diagnosis not present

## 2021-08-28 DIAGNOSIS — G4733 Obstructive sleep apnea (adult) (pediatric): Secondary | ICD-10-CM | POA: Diagnosis not present

## 2021-08-28 DIAGNOSIS — C259 Malignant neoplasm of pancreas, unspecified: Secondary | ICD-10-CM | POA: Diagnosis not present

## 2021-08-28 DIAGNOSIS — K5909 Other constipation: Secondary | ICD-10-CM | POA: Diagnosis not present

## 2021-08-28 DIAGNOSIS — Z1612 Extended spectrum beta lactamase (ESBL) resistance: Secondary | ICD-10-CM | POA: Diagnosis not present

## 2021-08-28 DIAGNOSIS — Z8616 Personal history of COVID-19: Secondary | ICD-10-CM | POA: Diagnosis not present

## 2021-08-28 DIAGNOSIS — E861 Hypovolemia: Secondary | ICD-10-CM | POA: Diagnosis not present

## 2021-08-28 DIAGNOSIS — E871 Hypo-osmolality and hyponatremia: Secondary | ICD-10-CM | POA: Diagnosis not present

## 2021-08-28 DIAGNOSIS — D61818 Other pancytopenia: Secondary | ICD-10-CM | POA: Diagnosis not present

## 2021-08-28 DIAGNOSIS — R0789 Other chest pain: Secondary | ICD-10-CM | POA: Diagnosis not present

## 2021-08-28 DIAGNOSIS — Z87891 Personal history of nicotine dependence: Secondary | ICD-10-CM | POA: Diagnosis not present

## 2021-08-28 DIAGNOSIS — Z79899 Other long term (current) drug therapy: Secondary | ICD-10-CM | POA: Diagnosis not present

## 2021-08-28 DIAGNOSIS — A419 Sepsis, unspecified organism: Secondary | ICD-10-CM | POA: Diagnosis not present

## 2021-08-28 DIAGNOSIS — B9689 Other specified bacterial agents as the cause of diseases classified elsewhere: Secondary | ICD-10-CM | POA: Diagnosis not present

## 2021-08-28 DIAGNOSIS — R000 Tachycardia, unspecified: Secondary | ICD-10-CM | POA: Diagnosis not present

## 2021-08-28 DIAGNOSIS — R509 Fever, unspecified: Secondary | ICD-10-CM | POA: Diagnosis not present

## 2021-08-28 DIAGNOSIS — F419 Anxiety disorder, unspecified: Secondary | ICD-10-CM | POA: Diagnosis not present

## 2021-08-28 DIAGNOSIS — Z8619 Personal history of other infectious and parasitic diseases: Secondary | ICD-10-CM | POA: Diagnosis not present

## 2021-08-28 DIAGNOSIS — Z8507 Personal history of malignant neoplasm of pancreas: Secondary | ICD-10-CM | POA: Diagnosis not present

## 2021-08-28 DIAGNOSIS — I10 Essential (primary) hypertension: Secondary | ICD-10-CM | POA: Diagnosis not present

## 2021-08-28 DIAGNOSIS — N400 Enlarged prostate without lower urinary tract symptoms: Secondary | ICD-10-CM | POA: Diagnosis not present

## 2021-08-28 DIAGNOSIS — R059 Cough, unspecified: Secondary | ICD-10-CM | POA: Diagnosis not present

## 2021-08-28 DIAGNOSIS — D72819 Decreased white blood cell count, unspecified: Secondary | ICD-10-CM | POA: Diagnosis not present

## 2021-08-28 DIAGNOSIS — Z20822 Contact with and (suspected) exposure to covid-19: Secondary | ICD-10-CM | POA: Diagnosis not present

## 2021-08-28 DIAGNOSIS — T504X5A Adverse effect of drugs affecting uric acid metabolism, initial encounter: Secondary | ICD-10-CM | POA: Diagnosis not present

## 2021-08-28 DIAGNOSIS — B960 Mycoplasma pneumoniae [M. pneumoniae] as the cause of diseases classified elsewhere: Secondary | ICD-10-CM | POA: Diagnosis not present

## 2021-08-28 DIAGNOSIS — R6510 Systemic inflammatory response syndrome (SIRS) of non-infectious origin without acute organ dysfunction: Secondary | ICD-10-CM | POA: Diagnosis not present

## 2021-08-28 DIAGNOSIS — B9620 Unspecified Escherichia coli [E. coli] as the cause of diseases classified elsewhere: Secondary | ICD-10-CM | POA: Diagnosis not present

## 2021-08-28 DIAGNOSIS — D61811 Other drug-induced pancytopenia: Secondary | ICD-10-CM | POA: Diagnosis not present

## 2021-08-28 DIAGNOSIS — R079 Chest pain, unspecified: Secondary | ICD-10-CM | POA: Diagnosis not present

## 2021-08-28 DIAGNOSIS — J302 Other seasonal allergic rhinitis: Secondary | ICD-10-CM | POA: Diagnosis not present

## 2021-08-28 DIAGNOSIS — K219 Gastro-esophageal reflux disease without esophagitis: Secondary | ICD-10-CM | POA: Diagnosis not present

## 2021-08-28 DIAGNOSIS — R7881 Bacteremia: Secondary | ICD-10-CM | POA: Diagnosis not present

## 2021-08-28 DIAGNOSIS — G4730 Sleep apnea, unspecified: Secondary | ICD-10-CM | POA: Diagnosis not present

## 2021-09-03 DIAGNOSIS — R7881 Bacteremia: Secondary | ICD-10-CM | POA: Diagnosis not present

## 2021-09-04 DIAGNOSIS — R7881 Bacteremia: Secondary | ICD-10-CM | POA: Diagnosis not present

## 2021-09-05 DIAGNOSIS — R7881 Bacteremia: Secondary | ICD-10-CM | POA: Diagnosis not present

## 2021-09-06 DIAGNOSIS — R7881 Bacteremia: Secondary | ICD-10-CM | POA: Diagnosis not present

## 2021-09-07 DIAGNOSIS — R7881 Bacteremia: Secondary | ICD-10-CM | POA: Diagnosis not present

## 2021-09-08 ENCOUNTER — Encounter: Payer: Self-pay | Admitting: Family Medicine

## 2021-09-08 DIAGNOSIS — R7881 Bacteremia: Secondary | ICD-10-CM | POA: Diagnosis not present

## 2021-09-08 NOTE — Assessment & Plan Note (Signed)
Continue nexium 

## 2021-09-08 NOTE — Assessment & Plan Note (Signed)
Well controlled. Continue amlodipine 10 mg once daily. ?Discontinue lisinopril 20 mg daily. Start on losartan 25 mg daily.  ?

## 2021-09-08 NOTE — Assessment & Plan Note (Signed)
>>  ASSESSMENT AND PLAN FOR HYPERTENSION WRITTEN ON 09/08/2021  9:03 PM BY COX, KIRSTEN, MD  Well controlled. Continue amlodipine 10 mg once daily. Discontinue lisinopril 20 mg daily. Start on losartan 25 mg daily.

## 2021-09-08 NOTE — Assessment & Plan Note (Addendum)
>>  ASSESSMENT AND PLAN FOR PANCREATIC CANCER METASTASIZED TO LIVER (Daniel Neal) WRITTEN ON 09/08/2021  9:04 PM BY Shaylie Eklund, MD  Management per specialist.  Oncology.    >>ASSESSMENT AND PLAN FOR PANCREATIC INSUFFICIENCY WRITTEN ON 09/08/2021  9:05 PM BY Bernard Slayden, MD  Continue creon.

## 2021-09-08 NOTE — Assessment & Plan Note (Signed)
Continue linzess 

## 2021-09-08 NOTE — Assessment & Plan Note (Signed)
>>  ASSESSMENT AND PLAN FOR GASTROESOPHAGEAL REFLUX DISEASE WITH ESOPHAGITIS WRITTEN ON 09/08/2021  9:04 PM BY COX, KIRSTEN, MD  Continue nexium.

## 2021-09-08 NOTE — Assessment & Plan Note (Signed)
Continue creon.  

## 2021-09-09 DIAGNOSIS — R7881 Bacteremia: Secondary | ICD-10-CM | POA: Diagnosis not present

## 2021-09-10 DIAGNOSIS — B9620 Unspecified Escherichia coli [E. coli] as the cause of diseases classified elsewhere: Secondary | ICD-10-CM | POA: Diagnosis not present

## 2021-09-10 DIAGNOSIS — C787 Secondary malignant neoplasm of liver and intrahepatic bile duct: Secondary | ICD-10-CM | POA: Diagnosis not present

## 2021-09-10 DIAGNOSIS — B999 Unspecified infectious disease: Secondary | ICD-10-CM | POA: Diagnosis not present

## 2021-09-10 DIAGNOSIS — R7881 Bacteremia: Secondary | ICD-10-CM | POA: Diagnosis not present

## 2021-09-10 DIAGNOSIS — C259 Malignant neoplasm of pancreas, unspecified: Secondary | ICD-10-CM | POA: Diagnosis not present

## 2021-09-11 DIAGNOSIS — C259 Malignant neoplasm of pancreas, unspecified: Secondary | ICD-10-CM | POA: Diagnosis not present

## 2021-09-11 DIAGNOSIS — M5030 Other cervical disc degeneration, unspecified cervical region: Secondary | ICD-10-CM | POA: Diagnosis not present

## 2021-09-11 DIAGNOSIS — M479 Spondylosis, unspecified: Secondary | ICD-10-CM | POA: Diagnosis not present

## 2021-09-11 DIAGNOSIS — R7881 Bacteremia: Secondary | ICD-10-CM | POA: Diagnosis not present

## 2021-09-11 DIAGNOSIS — C787 Secondary malignant neoplasm of liver and intrahepatic bile duct: Secondary | ICD-10-CM | POA: Diagnosis not present

## 2021-09-11 DIAGNOSIS — Z1612 Extended spectrum beta lactamase (ESBL) resistance: Secondary | ICD-10-CM | POA: Diagnosis not present

## 2021-09-11 DIAGNOSIS — A499 Bacterial infection, unspecified: Secondary | ICD-10-CM | POA: Diagnosis not present

## 2021-09-11 DIAGNOSIS — M47812 Spondylosis without myelopathy or radiculopathy, cervical region: Secondary | ICD-10-CM | POA: Diagnosis not present

## 2021-09-11 DIAGNOSIS — Z9221 Personal history of antineoplastic chemotherapy: Secondary | ICD-10-CM | POA: Diagnosis not present

## 2021-09-11 DIAGNOSIS — R112 Nausea with vomiting, unspecified: Secondary | ICD-10-CM | POA: Diagnosis not present

## 2021-09-11 DIAGNOSIS — Z8616 Personal history of COVID-19: Secondary | ICD-10-CM | POA: Diagnosis not present

## 2021-09-11 DIAGNOSIS — R197 Diarrhea, unspecified: Secondary | ICD-10-CM | POA: Diagnosis not present

## 2021-09-11 DIAGNOSIS — B961 Klebsiella pneumoniae [K. pneumoniae] as the cause of diseases classified elsewhere: Secondary | ICD-10-CM | POA: Diagnosis not present

## 2021-09-11 DIAGNOSIS — G629 Polyneuropathy, unspecified: Secondary | ICD-10-CM | POA: Diagnosis not present

## 2021-09-11 DIAGNOSIS — Z9049 Acquired absence of other specified parts of digestive tract: Secondary | ICD-10-CM | POA: Diagnosis not present

## 2021-09-11 DIAGNOSIS — C250 Malignant neoplasm of head of pancreas: Secondary | ICD-10-CM | POA: Diagnosis not present

## 2021-09-12 DIAGNOSIS — R7881 Bacteremia: Secondary | ICD-10-CM | POA: Diagnosis not present

## 2021-09-13 DIAGNOSIS — R7881 Bacteremia: Secondary | ICD-10-CM | POA: Diagnosis not present

## 2021-09-14 ENCOUNTER — Other Ambulatory Visit: Payer: Self-pay | Admitting: Family Medicine

## 2021-09-14 DIAGNOSIS — R7881 Bacteremia: Secondary | ICD-10-CM | POA: Diagnosis not present

## 2021-09-15 DIAGNOSIS — R7881 Bacteremia: Secondary | ICD-10-CM | POA: Diagnosis not present

## 2021-09-16 DIAGNOSIS — R7881 Bacteremia: Secondary | ICD-10-CM | POA: Diagnosis not present

## 2021-09-16 NOTE — Telephone Encounter (Signed)
Refill sent to pharmacy.   

## 2021-09-17 DIAGNOSIS — R7881 Bacteremia: Secondary | ICD-10-CM | POA: Diagnosis not present

## 2021-09-18 DIAGNOSIS — R7881 Bacteremia: Secondary | ICD-10-CM | POA: Diagnosis not present

## 2021-09-19 DIAGNOSIS — R7881 Bacteremia: Secondary | ICD-10-CM | POA: Diagnosis not present

## 2021-09-20 DIAGNOSIS — R7881 Bacteremia: Secondary | ICD-10-CM | POA: Diagnosis not present

## 2021-09-23 ENCOUNTER — Ambulatory Visit (INDEPENDENT_AMBULATORY_CARE_PROVIDER_SITE_OTHER): Payer: HMO | Admitting: Family Medicine

## 2021-09-23 ENCOUNTER — Encounter: Payer: Self-pay | Admitting: Family Medicine

## 2021-09-23 VITALS — BP 118/82 | HR 88 | Resp 18 | Ht 71.0 in | Wt 203.4 lb

## 2021-09-23 DIAGNOSIS — K8689 Other specified diseases of pancreas: Secondary | ICD-10-CM

## 2021-09-23 DIAGNOSIS — C787 Secondary malignant neoplasm of liver and intrahepatic bile duct: Secondary | ICD-10-CM | POA: Diagnosis not present

## 2021-09-23 DIAGNOSIS — I10 Essential (primary) hypertension: Secondary | ICD-10-CM | POA: Diagnosis not present

## 2021-09-23 DIAGNOSIS — K769 Liver disease, unspecified: Secondary | ICD-10-CM

## 2021-09-23 DIAGNOSIS — C259 Malignant neoplasm of pancreas, unspecified: Secondary | ICD-10-CM | POA: Diagnosis not present

## 2021-09-23 DIAGNOSIS — K5904 Chronic idiopathic constipation: Secondary | ICD-10-CM

## 2021-09-23 DIAGNOSIS — K2100 Gastro-esophageal reflux disease with esophagitis, without bleeding: Secondary | ICD-10-CM | POA: Diagnosis not present

## 2021-09-23 DIAGNOSIS — R7881 Bacteremia: Secondary | ICD-10-CM | POA: Diagnosis not present

## 2021-09-23 MED ORDER — AMLODIPINE BESYLATE 10 MG PO TABS: 5.0000 mg | ORAL_TABLET | Freq: Every day | ORAL | 1 refills | Status: DC

## 2021-09-23 MED ORDER — LORAZEPAM 0.5 MG PO TABS
0.5000 mg | ORAL_TABLET | Freq: Two times a day (BID) | ORAL | 1 refills | Status: DC | PRN
Start: 2021-09-23 — End: 2021-11-01

## 2021-09-23 MED ORDER — LOSARTAN POTASSIUM 25 MG PO TABS: 50.0000 mg | ORAL_TABLET | Freq: Every day | ORAL | 0 refills | Status: DC

## 2021-09-23 NOTE — Progress Notes (Unsigned)
In  Subjective:  [name], male    DOB: 06-12-1965  Age: 56 y.o. 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 Patient had liver ablation of most recent metastatic lesion 08/01/2021.  Patient was admitted for 4 days at [facility] for infection with another source.  He has been receiving IV antibiotics since then and will for total of 3 weeks.  He is scheduled to get a PET scan in the near future. For his hypertension we added Losartan  25 mg daily to amlodipine 10 mg daily.  His blood pressures been doing excellent.  He is still having some intermittent constipation and is requesting some Linzess samples.  We had discussed decreasing the amlodipine in case this was contributing to constipation at his last visit.  In addition he is requesting a refill of lorazepam.  He takes 0.5 mg nightly to calm his mind so he can go to sleep.   Current Outpatient Medications on File Prior to Visit  Medication Sig Dispense Refill   acetaminophen (TYLENOL) 500 MG tablet Take 500 mg by mouth every 6 (six) hours as needed.     cetirizine (ZYRTEC) 10 MG tablet Take by mouth.     CREON 36000-114000 units CPEP capsule TAKE 3 capsules with meals. 1 after starting/2 when finished eating. Take 2 capsules with snack after starting to eat.     dicyclomine (BENTYL) 10 MG capsule Take 10 mg by mouth 3 (three) times daily as needed.     esomeprazole (NEXIUM) 40 MG capsule Take 1 capsule (40 mg total) by mouth daily. 90 capsule 3   gabapentin (NEURONTIN) 300 MG capsule Take 1 capsule by mouth at bedtime.     guaifenesin (HUMIBID E) 400 MG TABS tablet Take by mouth as needed.     ibuprofen (ADVIL) 600 MG tablet Take 1 tablet (600 mg total) by mouth every 8 (eight) hours as needed. 30 tablet 1   linaclotide (LINZESS) 72 MCG capsule as needed.     meloxicam (MOBIC) 7.5 MG tablet TAKE ONE TABLET  BY MOUTH TWICE DAILY 60 tablet 11   Multiple Vitamin (MULTIVITAMIN) capsule Take 1 capsule by mouth daily.     Omega-3 1000 MG CAPS Take by mouth.     polyethylene glycol powder (GLYCOLAX/MIRALAX) 17 GM/SCOOP powder Take by mouth.     tamsulosin (FLOMAX) 0.4 MG CAPS capsule Take 1 capsule (0.4 mg total) by mouth daily for 30 days. 30 capsule 1   Thiamine HCl (VITAMIN B-1 PO) Take by mouth.     traMADol (ULTRAM) 50 MG tablet Take by mouth.     methocarbamol (ROBAXIN) 500 MG tablet Take 500 mg by mouth 2 (two) times daily. (Patient not taking: Reported on 09/23/2021)     No current facility-administered medications on file prior to visit.   Past Medical History:  Diagnosis Date   Barrett's esophagus    Cervicalgia    Essential hypertension    GERD (gastroesophageal reflux disease)    History of COVID-19    05/2019   Mixed hyperlipidemia    Pancreatic carcinoma metastatic to liver [facility])    Primary malignant neoplasm of head of pancreas [facility])    Past Surgical History:  Procedure Laterality Date   LAPAROSCOPIC CHOLECYSTECTOMY  01/05/2015   NASAL POLYP SURGERY     REFRACTIVE SURGERY Bilateral 1988   WHIPPLE PROCEDURE  11/09/2015   Pylorus sparring    Family History  Problem Relation Age of Onset   Cancer Mother        breast   Hypertension Father    Hyperlipidemia Father    Cancer Father        prostate   Hypertension Sister    Social History   Socioeconomic History   Marital status: Married    Spouse name: Not on file   Number of children: 2   Years of education: Not on file   Highest education level: Not on file  Occupational History   Occupation: Retired    Comment: [employer]  Tobacco Use   Smoking status: Former    Types: Cigars    Quit date: 2005    Years since quitting: 18.3   Smokeless tobacco: Never  Substance and Sexual Activity   Alcohol use: Not Currently   Drug use: Never   Sexual activity: Yes    Partners: Female  Other Topics Concern   Not on  file  Social History Narrative   Not on file   Social Determinants of Health   Financial Resource Strain: Not on file  Food Insecurity: Not on file  Transportation Needs: Not on file  Physical Activity: Not on file  Stress: Not on file  Social Connections: Not on file    Review of Systems  Constitutional:  Positive for fatigue. Negative for appetite change and fever.  HENT:  Negative for congestion, ear pain, sinus pressure and sore throat.   Respiratory:  Negative for cough, chest tightness, shortness of breath and wheezing.   Cardiovascular:  Negative for chest pain and palpitations.  Gastrointestinal:  Positive for abdominal pain and constipation. Negative for diarrhea, nausea and vomiting.  Genitourinary:  Negative for dysuria and hematuria.  Musculoskeletal:  Positive for neck pain. Negative for arthralgias, back pain, joint swelling and myalgias.  Skin:  Negative for rash.  Neurological:  Positive for weakness. Negative for dizziness and headaches.  Psychiatric/Behavioral:  Positive for sleep disturbance. Negative for dysphoric mood. The patient is not nervous/anxious.     Objective:  BP 118/82   Pulse 88   Resp 18   Ht '5\' 11"'$  (1.803 m)   Wt 203 lb 6.4 oz (92.3 kg)   BMI 28.37 kg/m      09/23/2021    3:19 PM 08/23/2021   11:00 AM 10/24/2020    7:50 AM  BP/Weight  Systolic BP 597 416 384  Diastolic BP 82 70 72  Wt. (Lbs) 203.4 200 223  BMI 28.37 kg/m2 27.89 kg/m2 31.1 kg/m2    Physical Exam Vitals reviewed.  Constitutional:      Appearance: Normal appearance. He is normal weight.  Cardiovascular:     Rate and Rhythm: Normal rate and regular rhythm.     Pulses: Normal pulses.     Heart sounds: Normal heart sounds.  Pulmonary:     Effort: Pulmonary effort is normal.     Breath sounds: Normal breath sounds.  Abdominal:     General: Abdomen is flat. Bowel sounds are normal.     Palpations: Abdomen is soft.  Neurological:     Mental Status: He is alert and  oriented to person, place, and time.  Psychiatric:        Mood and Affect: Mood normal.        Behavior: Behavior normal.    Diabetic Foot Exam - Simple   No data filed      Lab Results  Component Value Date   WBC 3.6 08/26/2021   HGB 14.6 08/26/2021   HCT 42.5 08/26/2021   PLT 196 08/26/2021   GLUCOSE 96 08/26/2021   CHOL 171 08/26/2021   TRIG 102 08/26/2021   HDL 56 08/26/2021   LDLCALC 97 08/26/2021   ALT 23 08/26/2021   AST 24 08/26/2021   NA 137 08/26/2021   K 4.6 08/26/2021   CL 99 08/26/2021   CREATININE 0.92 08/26/2021   BUN 11 08/26/2021   CO2 24 08/26/2021      Assessment & Plan:   Problem List Items Addressed This Visit       Cardiovascular and Mediastinum   Hypertension   Relevant Medications   losartan (COZAAR) 25 MG tablet   amLODipine (NORVASC) 10 MG tablet     Digestive   Pancreatic cancer metastasized to liver (HCC) - Primary   Relevant Medications   LORazepam (ATIVAN) 0.5 MG tablet   Chronic idiopathic constipation   Pancreatic insufficiency   Gastroesophageal reflux disease with esophagitis   Other Visit Diagnoses     Liver lesion         .  Meds ordered this encounter  Medications   LORazepam (ATIVAN) 0.5 MG tablet    Sig: Take 1 tablet (0.5 mg total) by mouth 3 times/day as needed-between meals & bedtime for anxiety.    Dispense:  30 tablet    Refill:  1   losartan (COZAAR) 25 MG tablet    Sig: Take 2 tablets (50 mg total) by mouth daily.    Dispense:  1 tablet    Refill:  0   amLODipine (NORVASC) 10 MG tablet    Sig: Take 0.5 tablets (5 mg total) by mouth daily.    Dispense:  1 tablet    Refill:  1    This prescription was filled on 01/30/2021. Any refills authorized will be placed on file.      Follow-up: Return in about 4 months (around 01/24/2022).  An After Visit Summary was printed and given to the patient.    I,Lauren M Auman,acting as a scribe for Rochel Brome, MD.,have documented all relevant documentation  on the behalf of Rochel Brome, MD,as directed by  Rochel Brome, MD while in the presence of Rochel Brome, MD.    Rochel Brome, MD [place] [phone]

## 2021-09-23 NOTE — Patient Instructions (Signed)
Increase losartan to 50 mg daily. Decrease amlodipine to 5 mg daily

## 2021-09-24 DIAGNOSIS — R7881 Bacteremia: Secondary | ICD-10-CM | POA: Diagnosis not present

## 2021-09-25 ENCOUNTER — Ambulatory Visit (INDEPENDENT_AMBULATORY_CARE_PROVIDER_SITE_OTHER): Payer: HMO | Admitting: Nurse Practitioner

## 2021-09-25 ENCOUNTER — Encounter: Payer: Self-pay | Admitting: Nurse Practitioner

## 2021-09-25 VITALS — BP 118/74 | HR 93 | Temp 97.2°F | Ht 71.0 in | Wt 204.0 lb

## 2021-09-25 DIAGNOSIS — C787 Secondary malignant neoplasm of liver and intrahepatic bile duct: Secondary | ICD-10-CM

## 2021-09-25 DIAGNOSIS — L989 Disorder of the skin and subcutaneous tissue, unspecified: Secondary | ICD-10-CM

## 2021-09-25 DIAGNOSIS — C259 Malignant neoplasm of pancreas, unspecified: Secondary | ICD-10-CM

## 2021-09-25 DIAGNOSIS — D84821 Immunodeficiency due to drugs: Secondary | ICD-10-CM | POA: Diagnosis not present

## 2021-09-25 DIAGNOSIS — Z79899 Other long term (current) drug therapy: Secondary | ICD-10-CM | POA: Diagnosis not present

## 2021-09-25 DIAGNOSIS — R7881 Bacteremia: Secondary | ICD-10-CM | POA: Diagnosis not present

## 2021-09-25 NOTE — Patient Instructions (Addendum)
Soak left foot in Epsom salt and warm water Monitor for signs and symptoms of infection Ice and elevate left foot Follow-up with Infectious Diseases as scheduled Go to 7:45 on Friday [facility] in [place] for podiatry [facility]  Skin Abscess  A skin abscess is an infected area on or under your skin that contains a collection of pus and other material. An abscess may also be called a furuncle, carbuncle, or boil. An abscess can occur in or on almost any part of your body. Some abscesses break open (rupture) on their own. Most continue to get worse unless they are treated. The infection can spread deeper into the body and eventually into your blood, which can make you feel ill. Treatment usually involves draining the abscess. What are the causes? An abscess occurs when germs, like bacteria, pass through your skin and cause an infection. This may be caused by: A scrape or cut on your skin. A puncture wound through your skin, including a needle injection or insect bite. Blocked oil or sweat glands. Blocked and infected hair follicles. A cyst that forms beneath your skin (sebaceous cyst) and becomes infected. What increases the risk? This condition is more likely to develop in people who: Have a weak body defense system (immune system). Have diabetes. Have dry and irritated skin. Get frequent injections or use illegal IV drugs. Have a foreign body in a wound, such as a splinter. Have problems with their lymph system or veins. What are the signs or symptoms? Symptoms of this condition include: A painful, firm bump under the skin. A bump with pus at the top. This may break through the skin and drain. Other symptoms include: Redness surrounding the abscess site. Warmth. Swelling of the lymph nodes (glands) near the abscess. Tenderness. A sore on the skin. How is this diagnosed? This condition may be diagnosed based on: A physical exam. Your medical  history. A sample of pus. This may be used to find out what is causing the infection. Blood tests. Imaging tests, such as an ultrasound, CT scan, or MRI. How is this treated? A small abscess that drains on its own may not need treatment. Treatment for larger abscesses may include: Moist heat or heat pack applied to the area several times a day. A procedure to drain the abscess (incision and drainage). Antibiotic medicines. For a severe abscess, you may first get antibiotics through an IV and then change to antibiotics by mouth. Follow these instructions at home: Medicines  Take over-the-counter and prescription medicines only as told by your health care provider. If you were prescribed an antibiotic medicine, take it as told by your health care provider. Do not stop taking the antibiotic even if you start to feel better. Abscess care  If you have an abscess that has not drained, apply heat to the affected area. Use the heat source that your health care provider recommends, such as a moist heat pack or a heating pad. Place a towel between your skin and the heat source. Leave the heat on for 20-30 minutes. Remove the heat if your skin turns bright red. This is especially important if you are unable to feel pain, heat, or cold. You may have a greater risk of getting burned. Follow instructions from your health care provider about how to take care of your abscess. Make sure you: Cover the abscess with a bandage (dressing). Change your dressing or gauze as told by your health care provider. Wash your  hands with soap and water before you change the dressing or gauze. If soap and water are not available, use hand sanitizer. Check your abscess every day for signs of a worsening infection. Check for: More redness, swelling, or pain. More fluid or blood. Warmth. More pus or a bad smell. General instructions To avoid spreading the infection: Do not share personal care items, towels, or hot tubs  with others. Avoid making skin contact with other people. Keep all follow-up visits as told by your health care provider. This is important. Contact a health care provider if you have: More redness, swelling, or pain around your abscess. More fluid or blood coming from your abscess. Warm skin around your abscess. More pus or a bad smell coming from your abscess. Muscle aches. Chills or a general ill feeling. Get help right away if you: Have severe pain. See red streaks on your skin spreading away from the abscess. See redness that spreads quickly. Have a fever or chills. Summary A skin abscess is an infected area on or under your skin that contains a collection of pus and other material. A small abscess that drains on its own may not need treatment. Treatment for larger abscesses may include having a procedure to drain the abscess and taking an antibiotic. This information is not intended to replace advice given to you by your health care provider. Make sure you discuss any questions you have with your health care provider. Document Revised: 01/28/2021 Document Reviewed: 01/28/2021 Elsevier Patient Education  [place].

## 2021-09-25 NOTE — Progress Notes (Signed)
Acute Office Visit  Subjective:    [name], male    DOB: 1966/03/26, 56 y.o.   MRN: [id number]  Chief Complaint  Patient presents with   Foot Pain    HPI: Patient is in today for left foot pain with circular skin lesion to plantar aspect. Denies trauma, falls, or knowingly stepping on foreign body barefooted. Onset of symptoms was yesterday. Treatment has included triple antibiotic ointment. He has recently been treated with daily IV Invanz  and po Bactrim for sepsis due to E.coli. Pt has history of pancreatic cancer with metastasis to liver. Underwent Whipple procedure and chemotherapy. 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 Past Medical History:  Diagnosis Date   Barrett's esophagus    Cervicalgia    Essential hypertension    GERD (gastroesophageal reflux disease)    History of COVID-19    05/2019   Mixed hyperlipidemia    Pancreatic carcinoma metastatic to liver [facility])    Primary malignant neoplasm of head of pancreas [facility])     Past Surgical History:  Procedure Laterality Date   LAPAROSCOPIC CHOLECYSTECTOMY  01/05/2015   NASAL POLYP SURGERY     REFRACTIVE SURGERY Bilateral 1988   WHIPPLE PROCEDURE  11/09/2015   Pylorus sparring    Family History  Problem Relation Age of Onset   Cancer Mother        breast   Hypertension Father    Hyperlipidemia Father    Cancer Father        prostate   Hypertension Sister     Social History   Socioeconomic History   Marital status: Married    Spouse name: Not on file   Number of children: 2   Years of education: Not on file   Highest education level: Not on file  Occupational History   Occupation: Retired    Comment: [employer]  Tobacco Use   Smoking status: Former    Types: Cigars    Quit date: 2005    Years since quitting: 18.4   Smokeless tobacco: Never  Substance and Sexual Activity   Alcohol use: Not Currently   Drug use: Never   Sexual activity: Yes     Partners: Female  Other Topics Concern   Not on file  Social History Narrative   Not on file   Social Determinants of Health   Financial Resource Strain: Not on file  Food Insecurity: Not on file  Transportation Needs: Not on file  Physical Activity: Not on file  Stress: Not on file  Social Connections: Not on file  Intimate Partner Violence: Not on file    Outpatient Medications Prior to Visit  Medication Sig Dispense Refill   acetaminophen (TYLENOL) 500 MG tablet Take 500 mg by mouth every 6 (six) hours as needed.     amLODipine (NORVASC) 10 MG tablet Take 0.5 tablets (5 mg total) by mouth daily. 1 tablet 1   cetirizine (ZYRTEC) 10 MG tablet Take by mouth.     CREON 36000-114000 units CPEP capsule TAKE 3 capsules with meals. 1 after starting/2 when finished eating. Take 2 capsules with snack after starting to eat.     dicyclomine (BENTYL) 10 MG capsule Take 10 mg by mouth 3 (three) times daily as needed.     esomeprazole (NEXIUM) 40 MG capsule Take 1 capsule (40 mg total) by mouth daily. 90 capsule 3   gabapentin (NEURONTIN) 300 MG capsule Take 1 capsule by mouth at bedtime.  guaifenesin (HUMIBID E) 400 MG TABS tablet Take by mouth as needed.     ibuprofen (ADVIL) 600 MG tablet Take 1 tablet (600 mg total) by mouth every 8 (eight) hours as needed. 30 tablet 1   linaclotide (LINZESS) 72 MCG capsule as needed.     LORazepam (ATIVAN) 0.5 MG tablet Take 1 tablet (0.5 mg total) by mouth 3 times/day as needed-between meals & bedtime for anxiety. 30 tablet 1   losartan (COZAAR) 25 MG tablet Take 2 tablets (50 mg total) by mouth daily. 1 tablet 0   meloxicam (MOBIC) 7.5 MG tablet TAKE ONE TABLET BY MOUTH TWICE DAILY 60 tablet 11   methocarbamol (ROBAXIN) 500 MG tablet Take 500 mg by mouth 2 (two) times daily. (Patient not taking: Reported on 09/23/2021)     Multiple Vitamin (MULTIVITAMIN) capsule Take 1 capsule by mouth daily.     Omega-3 1000 MG CAPS Take by mouth.     polyethylene  glycol powder (GLYCOLAX/MIRALAX) 17 GM/SCOOP powder Take by mouth.     tamsulosin (FLOMAX) 0.4 MG CAPS capsule Take 1 capsule (0.4 mg total) by mouth daily for 30 days. 30 capsule 1   Thiamine HCl (VITAMIN B-1 PO) Take by mouth.     traMADol (ULTRAM) 50 MG tablet Take by mouth.     No facility-administered medications prior to visit.    Allergies  Allergen Reactions   Trazodone And Nefazodone     Somnolence. Cloudy feeling.    Other Other (See Comments)    Prefers to not have coban wrap used due to the smell causes him to be sick   Oxycodone Itching   Vancomycin Rash    Review of Systems See pertinent positives and negatives per HPI.     Objective:    Physical Exam Vitals reviewed.  Cardiovascular:     Pulses:          Dorsalis pedis pulses are 2+ on the right side and 2+ on the left side.       Posterior tibial pulses are 2+ on the right side and 2+ on the left side.  Musculoskeletal:        General: Tenderness present.       Feet:  Feet:     Right foot:     Skin integrity: Skin integrity normal.     Toenail Condition: Right toenails are normal.     Left foot:     Toenail Condition: Left toenails are normal.     Comments: Palpable red soft tissue mass, measuring 1 cm in diameter Skin:    General: Skin is warm.     Capillary Refill: Capillary refill takes less than 2 seconds.     Findings: Lesion present.  Neurological:     General: No focal deficit present.     Mental Status: He is alert and oriented to person, place, and time.    BP 118/74   Pulse 93   Temp (!) 97.2 F (36.2 C)   Ht 5' 11"  (1.803 m)   Wt 204 lb (92.5 kg)   SpO2 99%   BMI 28.45 kg/m   Wt Readings from Last 3 Encounters:  09/25/21 204 lb (92.5 kg)  09/23/21 203 lb 6.4 oz (92.3 kg)  08/23/21 200 lb (90.7 kg)    Health Maintenance Due  Topic Date Due   COVID-19 Vaccine (1) Never done   HIV Screening  Never done   Hepatitis C Screening  Never done   TETANUS/TDAP  Never done  Zoster  Vaccines- Shingrix (1 of 2) Never done   COLONOSCOPY (Pts 45-67yr Insurance coverage will need to be confirmed)  Never done   Lab Results  Component Value Date   WBC 3.6 08/26/2021   HGB 14.6 08/26/2021   HCT 42.5 08/26/2021   MCV 90 08/26/2021   PLT 196 08/26/2021   Lab Results  Component Value Date   NA 137 08/26/2021   K 4.6 08/26/2021   CO2 24 08/26/2021   GLUCOSE 96 08/26/2021   BUN 11 08/26/2021   CREATININE 0.92 08/26/2021   BILITOT 0.4 08/26/2021   ALKPHOS 78 08/26/2021   AST 24 08/26/2021   ALT 23 08/26/2021   PROT 6.6 08/26/2021   ALBUMIN 4.4 08/26/2021   CALCIUM 9.6 08/26/2021   EGFR 96 08/26/2021   Lab Results  Component Value Date   CHOL 171 08/26/2021   Lab Results  Component Value Date   HDL 56 08/26/2021   Lab Results  Component Value Date   LDLCALC 97 08/26/2021   Lab Results  Component Value Date   TRIG 102 08/26/2021   Lab Results  Component Value Date   CHOLHDL 3.1 08/26/2021        Assessment & Plan:    1. Skin lesion of foot - Ambulatory referral to Podiatry  2. Immunocompromised state due to drug therapy ([place]  3. Pancreatic cancer metastasized to liver ([facility]   Soak left foot in Epsom salt and warm water Monitor for signs and symptoms of infection Ice and elevate left foot Follow-up with Infectious Diseases as scheduled [facility] 7[facility] [place] [facility]    Follow-up: PRN  An After Visit Summary was printed and given to the patient.  I, SRip Harbour NP, have reviewed all documentation for this visit. The documentation on 09/25/21 for the exam, diagnosis, procedures, and orders are all accurate and complete.   Signed, SRip Harbour NP [phone]

## 2021-09-26 DIAGNOSIS — R7881 Bacteremia: Secondary | ICD-10-CM | POA: Diagnosis not present

## 2021-09-26 NOTE — Assessment & Plan Note (Signed)
>>  ASSESSMENT AND PLAN FOR PANCREATIC INSUFFICIENCY WRITTEN ON 09/26/2021  4:30 PM BY COX, KIRSTEN, MD  Continue Creon.  Continue Bentyl.

## 2021-09-26 NOTE — Assessment & Plan Note (Signed)
Continue Creon.  Continue Bentyl.

## 2021-09-26 NOTE — Assessment & Plan Note (Signed)
>>  ASSESSMENT AND PLAN FOR HYPERTENSION WRITTEN ON 09/26/2021  4:29 PM BY COX, KIRSTEN, MD  Increase losartan to 50 mg daily. Decrease amlodipine to 5 mg daily

## 2021-09-26 NOTE — Assessment & Plan Note (Signed)
>>  ASSESSMENT AND PLAN FOR GASTROESOPHAGEAL REFLUX DISEASE WITH ESOPHAGITIS WRITTEN ON 09/26/2021  4:30 PM BY COX, KIRSTEN, MD  Continue Nexium.

## 2021-09-26 NOTE — Assessment & Plan Note (Signed)
Increase losartan to 50 mg daily. Decrease amlodipine to 5 mg daily

## 2021-09-26 NOTE — Assessment & Plan Note (Signed)
Continue Nexium 

## 2021-09-26 NOTE — Assessment & Plan Note (Signed)
Linzess samples given.  If patient needs a prescription, please let us know.  Hopefully decreasing amlodipine will decrease constipation.

## 2021-09-27 ENCOUNTER — Ambulatory Visit (INDEPENDENT_AMBULATORY_CARE_PROVIDER_SITE_OTHER): Payer: HMO | Admitting: Podiatry

## 2021-09-27 ENCOUNTER — Ambulatory Visit (INDEPENDENT_AMBULATORY_CARE_PROVIDER_SITE_OTHER): Payer: HMO

## 2021-09-27 ENCOUNTER — Other Ambulatory Visit: Payer: Self-pay | Admitting: Podiatry

## 2021-09-27 ENCOUNTER — Encounter: Payer: Self-pay | Admitting: Podiatry

## 2021-09-27 DIAGNOSIS — M7732 Calcaneal spur, left foot: Secondary | ICD-10-CM | POA: Diagnosis not present

## 2021-09-27 DIAGNOSIS — Z87828 Personal history of other (healed) physical injury and trauma: Secondary | ICD-10-CM | POA: Diagnosis not present

## 2021-09-27 DIAGNOSIS — R2242 Localized swelling, mass and lump, left lower limb: Secondary | ICD-10-CM

## 2021-09-27 DIAGNOSIS — R7881 Bacteremia: Secondary | ICD-10-CM | POA: Diagnosis not present

## 2021-09-27 NOTE — Progress Notes (Signed)
  Subjective:  [name], male    DOB: 21-Jul-1965,   MRN: [id number]  Chief Complaint  Patient presents with   lesion    lesion on bottom of left foot    56 y.o. male presents for concern for lesion on the bottom of his left foot that has been going on for about a week. Possible it has been there longer. No real pain but uncomfortable. Relates it has drained.   Denies injury or stepping on anything. Has been on antibiotics for treatment of Ecoli and has a history of pancreatic cancer with metastasis to the liver.  Denies any other pedal complaints. Denies n/v/f/c.   Past Medical History:  Diagnosis Date   Barrett's esophagus    Cervicalgia    Essential hypertension    GERD (gastroesophageal reflux disease)    History of COVID-19    05/2019   Mixed hyperlipidemia    Pancreatic carcinoma metastatic to liver [facility])    Primary malignant neoplasm of head of pancreas (HCC)     Objective:  Physical Exam: Vascular: DP/PT pulses 2/4 bilateral. CFT <3 seconds. Normal hair growth on digits. No edema.  Skin. No lacerations or abrasions bilateral feet. 1 cm lesion noted to the plantar left foot slightly raised with various hues of black white and blue noted throughout lesions. Area of fluctuance noted underneath. No open draining areas. Irregular border noted.  Musculoskeletal: MMT 5/5 bilateral lower extremities in DF, PF, Inversion and Eversion. Deceased ROM in DF of ankle joint.  Neurological: Sensation intact to light touch.   Assessment:   1. Mass of skin of left foot      Plan:  Patient was evaluated and treated and all questions answered. X-rays reviewed and discussed with patient. No bony abnormalities noted.  Discussed lesions and possible diagnosis and treatment options with the patient. Discussed punch biopsy to evaluate lesion and culture to determine if any level of infection associated as well.  Advised patient on postprocedure protocol. Patient to follow-up  in 2 weeks or sooner if symptoms worsen or fail to improve.    Procedure: Punch biopsy, left foot lesion Discussed alternatives, risks, complications and verbal consent was obtained.  Location: plantar left foot Skin Prep: Alcohol. Injectate: 3 cc 1 % lidocaine.  44m punch biopsy was obtained from lesion site and placed in sterile specimen container to be sent for biopsy.  Culture was obtained from area as well.  Disposition: Patient tolerated procedure well. Injection site dressed with a band-aid. Compression bandage applied.  Post-procedure care was discussed and return precautions discussed.    RLorenda Peck DPM

## 2021-09-28 DIAGNOSIS — R7881 Bacteremia: Secondary | ICD-10-CM | POA: Diagnosis not present

## 2021-09-29 DIAGNOSIS — R7881 Bacteremia: Secondary | ICD-10-CM | POA: Diagnosis not present

## 2021-09-30 DIAGNOSIS — R7881 Bacteremia: Secondary | ICD-10-CM | POA: Diagnosis not present

## 2021-10-01 DIAGNOSIS — R7881 Bacteremia: Secondary | ICD-10-CM | POA: Diagnosis not present

## 2021-10-02 DIAGNOSIS — R7881 Bacteremia: Secondary | ICD-10-CM | POA: Diagnosis not present

## 2021-10-02 NOTE — Addendum Note (Signed)
Addended by: Geni Bers on: 10/02/2021 08:44 AM   Modules accepted: Orders

## 2021-10-03 ENCOUNTER — Telehealth: Payer: Self-pay | Admitting: *Deleted

## 2021-10-03 DIAGNOSIS — C787 Secondary malignant neoplasm of liver and intrahepatic bile duct: Secondary | ICD-10-CM | POA: Diagnosis not present

## 2021-10-03 DIAGNOSIS — R7881 Bacteremia: Secondary | ICD-10-CM | POA: Diagnosis not present

## 2021-10-03 DIAGNOSIS — C259 Malignant neoplasm of pancreas, unspecified: Secondary | ICD-10-CM | POA: Diagnosis not present

## 2021-10-03 DIAGNOSIS — C258 Malignant neoplasm of overlapping sites of pancreas: Secondary | ICD-10-CM | POA: Diagnosis not present

## 2021-10-03 NOTE — Telephone Encounter (Signed)
Yes there were one specimen sent for pathology smear and there was also a swab that was sent for wound culture.  Thanks

## 2021-10-03 NOTE — Telephone Encounter (Signed)
Kim w/ [facility] is calling for clarification on an specimen sent, was received in formlin but said that it was a wound culture. Returned call to explain that it was a mass of skin, left foot, pathologist smear review.Please advise.

## 2021-10-04 DIAGNOSIS — R7881 Bacteremia: Secondary | ICD-10-CM | POA: Diagnosis not present

## 2021-10-04 LAB — PATHOLOGY REPORT

## 2021-10-07 LAB — TISSUE SPECIMEN

## 2021-10-10 ENCOUNTER — Ambulatory Visit (INDEPENDENT_AMBULATORY_CARE_PROVIDER_SITE_OTHER): Payer: HMO | Admitting: Podiatry

## 2021-10-10 ENCOUNTER — Encounter: Payer: Self-pay | Admitting: Podiatry

## 2021-10-10 DIAGNOSIS — M205X2 Other deformities of toe(s) (acquired), left foot: Secondary | ICD-10-CM | POA: Diagnosis not present

## 2021-10-10 DIAGNOSIS — R2242 Localized swelling, mass and lump, left lower limb: Secondary | ICD-10-CM | POA: Diagnosis not present

## 2021-10-10 DIAGNOSIS — M21612 Bunion of left foot: Secondary | ICD-10-CM

## 2021-10-10 NOTE — Progress Notes (Signed)
  Subjective:  [name], male    DOB: 08-Oct-1965,   MRN: [id number]  Chief Complaint  Patient presents with   Callouses    The spot on the arch of the left foot is better and not as red and has a hard skin to it and not much swelling   Nail Problem    The left 3rd toe I trimmed the nail a few days ago and is sore and I did use the antibiotic cream    56 y.o. male presents for follow-up of left foot lesion. Here to review biopsy results. Has finsihed antibtioics. Has stopped dressing as area has healed and improved a lot. Relates having some pain in left great toe and wondering about  a bunion.  Denies any other pedal complaints. Denies n/v/f/c.   Past Medical History:  Diagnosis Date   Barrett's esophagus    Cervicalgia    Essential hypertension    GERD (gastroesophageal reflux disease)    History of COVID-19    05/2019   Mixed hyperlipidemia    Pancreatic carcinoma metastatic to liver [facility])    Primary malignant neoplasm of head of pancreas (HCC)     Objective:  Physical Exam: Vascular: DP/PT pulses 2/4 bilateral. CFT <3 seconds. Normal hair growth on digits. No edema.  Skin. No lacerations or abrasions bilateral feet. 1 cm lesion noted to the plantar left foot slightly raised with various hues of black white and blue noted throughout lesions. Area of fluctuance noted underneath. No open draining areas. Irregular border noted.  Musculoskeletal: MMT 5/5 bilateral lower extremities in DF, PF, Inversion and Eversion. Deceased ROM in DF of ankle joint. Mild decrease in ROM of first MPJ No tenderness to palaption around medial eminence or first MPJ.  Neurological: Sensation intact to light touch.   Assessment:   1. Mass of skin of left foot   2. Hallux limitus of left foot   3. Bunion, left foot       Plan:  Patient was evaluated and treated and all questions answered. X-rays reviewed and discussed with patient. No bony abnormalities noted.  Discussed lesions  and possible diagnosis and treatment options with the patient. -Biopsy reviewed and awaiting culture resutsl.  Biopsy: SEctions show benign skin with chronic  inflammation, ischemic changes, and vascular  proliferation, consistent with a healing wound.   Significant atypia or malignancy is not  identified in the current biopsy.  -Area has healed well and is improving.  -Wondering about pain in big toe and is likley due to neuropathy from chemo although does have some arthritis changes in left first MPJ noted on X-ray.  Patient to follow-up as needed if any changes or concerns in feet.       Lorenda Peck, DPM

## 2021-10-11 DIAGNOSIS — C229 Malignant neoplasm of liver, not specified as primary or secondary: Secondary | ICD-10-CM | POA: Diagnosis not present

## 2021-10-14 DIAGNOSIS — Z9861 Coronary angioplasty status: Secondary | ICD-10-CM | POA: Diagnosis not present

## 2021-10-14 DIAGNOSIS — R152 Fecal urgency: Secondary | ICD-10-CM | POA: Diagnosis not present

## 2021-10-14 DIAGNOSIS — R519 Headache, unspecified: Secondary | ICD-10-CM | POA: Diagnosis not present

## 2021-10-14 DIAGNOSIS — A0472 Enterocolitis due to Clostridium difficile, not specified as recurrent: Secondary | ICD-10-CM | POA: Diagnosis not present

## 2021-10-14 DIAGNOSIS — J019 Acute sinusitis, unspecified: Secondary | ICD-10-CM | POA: Diagnosis not present

## 2021-10-14 DIAGNOSIS — Z809 Family history of malignant neoplasm, unspecified: Secondary | ICD-10-CM | POA: Diagnosis not present

## 2021-10-14 DIAGNOSIS — I10 Essential (primary) hypertension: Secondary | ICD-10-CM | POA: Diagnosis not present

## 2021-10-14 DIAGNOSIS — B9689 Other specified bacterial agents as the cause of diseases classified elsewhere: Secondary | ICD-10-CM | POA: Diagnosis not present

## 2021-10-14 DIAGNOSIS — Z792 Long term (current) use of antibiotics: Secondary | ICD-10-CM | POA: Diagnosis not present

## 2021-10-14 DIAGNOSIS — C259 Malignant neoplasm of pancreas, unspecified: Secondary | ICD-10-CM | POA: Diagnosis not present

## 2021-10-14 DIAGNOSIS — C787 Secondary malignant neoplasm of liver and intrahepatic bile duct: Secondary | ICD-10-CM | POA: Diagnosis not present

## 2021-10-14 DIAGNOSIS — J328 Other chronic sinusitis: Secondary | ICD-10-CM | POA: Diagnosis not present

## 2021-10-14 DIAGNOSIS — R7881 Bacteremia: Secondary | ICD-10-CM | POA: Diagnosis not present

## 2021-10-14 DIAGNOSIS — E785 Hyperlipidemia, unspecified: Secondary | ICD-10-CM | POA: Diagnosis not present

## 2021-10-14 DIAGNOSIS — Z90411 Acquired partial absence of pancreas: Secondary | ICD-10-CM | POA: Diagnosis not present

## 2021-10-14 DIAGNOSIS — R509 Fever, unspecified: Secondary | ICD-10-CM | POA: Diagnosis not present

## 2021-10-14 DIAGNOSIS — K2100 Gastro-esophageal reflux disease with esophagitis, without bleeding: Secondary | ICD-10-CM | POA: Diagnosis not present

## 2021-10-14 DIAGNOSIS — Z8507 Personal history of malignant neoplasm of pancreas: Secondary | ICD-10-CM | POA: Diagnosis not present

## 2021-10-14 DIAGNOSIS — Z8619 Personal history of other infectious and parasitic diseases: Secondary | ICD-10-CM | POA: Diagnosis not present

## 2021-10-14 DIAGNOSIS — Z1612 Extended spectrum beta lactamase (ESBL) resistance: Secondary | ICD-10-CM | POA: Diagnosis not present

## 2021-10-14 DIAGNOSIS — J3489 Other specified disorders of nose and nasal sinuses: Secondary | ICD-10-CM | POA: Diagnosis not present

## 2021-10-14 DIAGNOSIS — Z8616 Personal history of COVID-19: Secondary | ICD-10-CM | POA: Diagnosis not present

## 2021-10-14 DIAGNOSIS — E860 Dehydration: Secondary | ICD-10-CM | POA: Diagnosis not present

## 2021-10-14 DIAGNOSIS — C250 Malignant neoplasm of head of pancreas: Secondary | ICD-10-CM | POA: Diagnosis not present

## 2021-10-26 ENCOUNTER — Other Ambulatory Visit: Payer: Self-pay | Admitting: Family Medicine

## 2021-10-31 ENCOUNTER — Other Ambulatory Visit: Payer: Self-pay | Admitting: Family Medicine

## 2021-10-31 ENCOUNTER — Telehealth: Payer: Self-pay

## 2021-10-31 MED ORDER — AMLODIPINE BESYLATE 5 MG PO TABS: 5.0000 mg | ORAL_TABLET | Freq: Every day | ORAL | 1 refills | Status: DC

## 2021-10-31 MED ORDER — LOSARTAN POTASSIUM 50 MG PO TABS: 50.0000 mg | ORAL_TABLET | Freq: Every day | ORAL | 1 refills | Status: DC

## 2021-10-31 NOTE — Telephone Encounter (Signed)
Patient is requesting new scripts for losartan and amlodipine. Doses were changed at 09/23/21 office visit. Please advise.   Harrell Lark 10/31/21 9:47 AM

## 2021-11-01 ENCOUNTER — Other Ambulatory Visit: Payer: Self-pay

## 2021-11-01 ENCOUNTER — Other Ambulatory Visit: Payer: Self-pay | Admitting: Family Medicine

## 2021-11-01 MED ORDER — LOSARTAN POTASSIUM 50 MG PO TABS: 50.0000 mg | ORAL_TABLET | Freq: Every day | ORAL | 1 refills | Status: DC

## 2021-11-04 ENCOUNTER — Other Ambulatory Visit: Payer: Self-pay

## 2021-11-04 MED ORDER — LOSARTAN POTASSIUM 50 MG PO TABS: 50.0000 mg | ORAL_TABLET | Freq: Every day | ORAL | 1 refills | Status: DC

## 2021-11-06 ENCOUNTER — Other Ambulatory Visit: Payer: Self-pay | Admitting: Family Medicine

## 2021-11-06 DIAGNOSIS — Z923 Personal history of irradiation: Secondary | ICD-10-CM | POA: Diagnosis not present

## 2021-11-06 DIAGNOSIS — Z9221 Personal history of antineoplastic chemotherapy: Secondary | ICD-10-CM | POA: Diagnosis not present

## 2021-11-06 DIAGNOSIS — Z9049 Acquired absence of other specified parts of digestive tract: Secondary | ICD-10-CM | POA: Diagnosis not present

## 2021-11-06 DIAGNOSIS — M5030 Other cervical disc degeneration, unspecified cervical region: Secondary | ICD-10-CM | POA: Diagnosis not present

## 2021-11-06 DIAGNOSIS — C259 Malignant neoplasm of pancreas, unspecified: Secondary | ICD-10-CM | POA: Diagnosis not present

## 2021-11-06 DIAGNOSIS — R7881 Bacteremia: Secondary | ICD-10-CM | POA: Diagnosis not present

## 2021-11-06 DIAGNOSIS — G893 Neoplasm related pain (acute) (chronic): Secondary | ICD-10-CM | POA: Diagnosis not present

## 2021-11-06 DIAGNOSIS — Z9889 Other specified postprocedural states: Secondary | ICD-10-CM | POA: Diagnosis not present

## 2021-11-06 DIAGNOSIS — R112 Nausea with vomiting, unspecified: Secondary | ICD-10-CM | POA: Diagnosis not present

## 2021-11-06 DIAGNOSIS — R197 Diarrhea, unspecified: Secondary | ICD-10-CM | POA: Diagnosis not present

## 2021-11-06 DIAGNOSIS — G629 Polyneuropathy, unspecified: Secondary | ICD-10-CM | POA: Diagnosis not present

## 2021-11-06 DIAGNOSIS — A0472 Enterocolitis due to Clostridium difficile, not specified as recurrent: Secondary | ICD-10-CM | POA: Diagnosis not present

## 2021-11-06 DIAGNOSIS — K1230 Oral mucositis (ulcerative), unspecified: Secondary | ICD-10-CM | POA: Diagnosis not present

## 2021-11-06 DIAGNOSIS — D72810 Lymphocytopenia: Secondary | ICD-10-CM | POA: Diagnosis not present

## 2021-11-06 DIAGNOSIS — C787 Secondary malignant neoplasm of liver and intrahepatic bile duct: Secondary | ICD-10-CM | POA: Diagnosis not present

## 2021-11-06 DIAGNOSIS — M47812 Spondylosis without myelopathy or radiculopathy, cervical region: Secondary | ICD-10-CM | POA: Diagnosis not present

## 2021-11-06 DIAGNOSIS — Z8616 Personal history of COVID-19: Secondary | ICD-10-CM | POA: Diagnosis not present

## 2021-11-13 ENCOUNTER — Encounter: Payer: Self-pay | Admitting: *Deleted

## 2021-11-13 NOTE — Progress Notes (Signed)
[facility] Quality Team Note  Name: Daniel Neal Date of Birth: Aug 25, 1965 MRN: [id number] Date: 11/13/2021  [facility] Quality Team has reviewed this patient's chart, please see recommendations below:  TRC-MRP; Patient was recently discharged from hospital on (10/18/2021). Please schedule office visit or telephone call to complete medication reconciliation prior to MRP deadline (11/17/2021).

## 2021-11-15 DIAGNOSIS — C259 Malignant neoplasm of pancreas, unspecified: Secondary | ICD-10-CM | POA: Diagnosis not present

## 2021-11-15 DIAGNOSIS — C787 Secondary malignant neoplasm of liver and intrahepatic bile duct: Secondary | ICD-10-CM | POA: Diagnosis not present

## 2021-11-15 DIAGNOSIS — Z79899 Other long term (current) drug therapy: Secondary | ICD-10-CM | POA: Diagnosis not present

## 2021-11-20 ENCOUNTER — Telehealth: Payer: Self-pay | Admitting: Pharmacist

## 2021-11-20 DIAGNOSIS — Z1612 Extended spectrum beta lactamase (ESBL) resistance: Secondary | ICD-10-CM | POA: Diagnosis not present

## 2021-11-20 DIAGNOSIS — R531 Weakness: Secondary | ICD-10-CM | POA: Diagnosis not present

## 2021-11-20 DIAGNOSIS — B961 Klebsiella pneumoniae [K. pneumoniae] as the cause of diseases classified elsewhere: Secondary | ICD-10-CM | POA: Diagnosis not present

## 2021-11-20 DIAGNOSIS — R7881 Bacteremia: Secondary | ICD-10-CM | POA: Diagnosis not present

## 2021-11-20 DIAGNOSIS — Z90411 Acquired partial absence of pancreas: Secondary | ICD-10-CM | POA: Diagnosis not present

## 2021-11-20 DIAGNOSIS — C259 Malignant neoplasm of pancreas, unspecified: Secondary | ICD-10-CM | POA: Diagnosis not present

## 2021-11-20 DIAGNOSIS — C771 Secondary and unspecified malignant neoplasm of intrathoracic lymph nodes: Secondary | ICD-10-CM | POA: Diagnosis not present

## 2021-11-20 DIAGNOSIS — C250 Malignant neoplasm of head of pancreas: Secondary | ICD-10-CM | POA: Diagnosis not present

## 2021-11-20 DIAGNOSIS — Z9049 Acquired absence of other specified parts of digestive tract: Secondary | ICD-10-CM | POA: Diagnosis not present

## 2021-11-20 DIAGNOSIS — Z9889 Other specified postprocedural states: Secondary | ICD-10-CM | POA: Diagnosis not present

## 2021-11-20 DIAGNOSIS — G893 Neoplasm related pain (acute) (chronic): Secondary | ICD-10-CM | POA: Diagnosis not present

## 2021-11-20 DIAGNOSIS — C787 Secondary malignant neoplasm of liver and intrahepatic bile duct: Secondary | ICD-10-CM | POA: Diagnosis not present

## 2021-11-20 DIAGNOSIS — R630 Anorexia: Secondary | ICD-10-CM | POA: Diagnosis not present

## 2021-11-20 DIAGNOSIS — Z6826 Body mass index (BMI) 26.0-26.9, adult: Secondary | ICD-10-CM | POA: Diagnosis not present

## 2021-11-20 DIAGNOSIS — Z5111 Encounter for antineoplastic chemotherapy: Secondary | ICD-10-CM | POA: Diagnosis not present

## 2021-11-20 DIAGNOSIS — Z8616 Personal history of COVID-19: Secondary | ICD-10-CM | POA: Diagnosis not present

## 2021-11-20 DIAGNOSIS — M5030 Other cervical disc degeneration, unspecified cervical region: Secondary | ICD-10-CM | POA: Diagnosis not present

## 2021-11-20 DIAGNOSIS — Z9221 Personal history of antineoplastic chemotherapy: Secondary | ICD-10-CM | POA: Diagnosis not present

## 2021-11-20 DIAGNOSIS — R634 Abnormal weight loss: Secondary | ICD-10-CM | POA: Diagnosis not present

## 2021-11-20 DIAGNOSIS — Z79633 Long term (current) use of mitotic inhibitor: Secondary | ICD-10-CM | POA: Diagnosis not present

## 2021-11-20 DIAGNOSIS — B9629 Other Escherichia coli [E. coli] as the cause of diseases classified elsewhere: Secondary | ICD-10-CM | POA: Diagnosis not present

## 2021-11-20 DIAGNOSIS — E860 Dehydration: Secondary | ICD-10-CM | POA: Diagnosis not present

## 2021-11-20 DIAGNOSIS — Z79631 Long term (current) use of antimetabolite agent: Secondary | ICD-10-CM | POA: Diagnosis not present

## 2021-11-20 NOTE — Progress Notes (Signed)
[name]) Care Management  Daniel Neal   11/20/2021  Daniel Neal [birth year] [id number]   Reason for referral: Medication Reconciliation Post Discharge  Referral source: Quality Team Current insurance:HTA  HPI: 56 y.o. 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    Objective: Lab Results  Component Value Date   CREATININE 0.92 08/26/2021    No results found for: "HGBA1C"  Lipid Panel     Component Value Date/Time   CHOL 171 08/26/2021 0813   TRIG 102 08/26/2021 0813   HDL 56 08/26/2021 0813   CHOLHDL 3.1 08/26/2021 0813   LDLCALC 97 08/26/2021 0813    BP Readings from Last 3 Encounters:  09/25/21 118/74  09/23/21 118/82  08/23/21 106/70    Allergies  Allergen Reactions   Trazodone And Nefazodone     Somnolence. Cloudy feeling.    Other Other (See Comments)    Prefers to not have coban wrap used due to the smell causes him to be sick   Oxycodone Itching   Vancomycin Rash    Medications Reviewed Today     Reviewed by Lorenda Peck, DPM (Physician) on 10/10/21 at [facility] [place] List Status: <None>   Medication Order Taking? Sig Documenting Provider Last Dose Status Informant  acetaminophen (TYLENOL) 500 MG tablet [id number] No Take 500 mg by mouth every 6 (six) hours as needed. [provider] Taking Active   amLODipine (NORVASC) 10 MG tablet [id number]  Take 0.5 tablets (5 mg total) by mouth daily. Cox, Kirsten, MD  Active   cetirizine (ZYRTEC) 10 MG tablet [id number] No Take by mouth. [provider] Taking Active   CREON 36000-114000 units CPEP capsule [id number] No TAKE 3 capsules with meals. 1 after starting/2 when finished eating. Take 2 capsules with snack after starting to eat. [provider] Taking Active   dicyclomine  (BENTYL) 10 MG capsule [id number] No Take 10 mg by mouth 3 (three) times daily as needed. [provider] Taking Active   ertapenem [facility]) IVPB [id number]  Inject into the vein. [provider]  Active   esomeprazole (NEXIUM) 40 MG capsule [id number] No Take 1 capsule (40 mg total) by mouth daily. Cox, Kirsten, MD Taking Active   gabapentin (NEURONTIN) 300 MG capsule [id number] No Take 1 capsule by mouth at bedtime. [provider] Taking Active   guaifenesin (HUMIBID E) 400 MG TABS tablet [id number] No Take by mouth as needed. [provider] Taking Active   ibuprofen (ADVIL) 600 MG tablet [id number] No Take 1 tablet (600 mg total) by mouth every 8 (eight) hours as needed. Rip Harbour, NP Taking Active   linaclotide Rolan Lipa) 72 MCG capsule [id number] No as needed. [provider] Taking Active   LORazepam (ATIVAN) 0.5 MG tablet [id number]  Take 1 tablet (0.5 mg total) by mouth 3 times/day as needed-between meals & bedtime for anxiety. Cox, Kirsten, MD  Active   losartan (COZAAR) 25 MG tablet [id number]  Take 2 tablets (50 mg total) by mouth daily. Cox, Kirsten, MD  Active   meloxicam [facility]) 7.5 MG tablet [id number] No TAKE ONE TABLET BY MOUTH TWICE DAILY Cox, Kirsten, MD Taking Active   methocarbamol (ROBAXIN) 500 MG tablet [id number] No Take 500 mg by mouth 2 (two) times daily.  Patient not taking: Reported on 09/23/2021  [provider] Not Taking Active   Multiple Vitamin (MULTIVITAMIN) capsule [id number] No Take 1 capsule by mouth daily. [provider] Taking Active   Omega-3 1000 MG CAPS [id number] No Take by mouth. [provider] Taking Active   polyethylene glycol powder (GLYCOLAX/MIRALAX) 17 GM/SCOOP powder [id number] No Take by mouth. [provider] Taking Active   sulfamethoxazole-trimethoprim (BACTRIM DS) 800-160 MG tablet [id number]  Take 1 tablet by mouth 2 (two) times daily. [provider]  Active    tamsulosin (FLOMAX) 0.4 MG CAPS capsule [id number] No Take 1 capsule (0.4 mg total) by mouth daily for 30 days. Cox, Kirsten, MD Taking Active   Thiamine HCl (VITAMIN B-1 PO) [id number] No Take by mouth. [provider] Taking Active   traMADol (ULTRAM) 50 MG tablet [id number] No Take by mouth. [provider] Taking Active              ASSESSMENT: Date Discharged from Hospital: 10/18/2021 Date Medication Reconciliation Performed: 11/20/2021   Medications Discontinued at Discharge:  azelastine 137 mcg (0.1 %) nasal spray Commonly known as: ASTELIN  dicyclomine 10 MG capsule Commonly known as: BENTYL  guaiFENesin 600 mg 12 hr tablet Commonly known as: MUCINEX  ipratropium 42 mcg (0.06 %) nasal spray Commonly known as: ATROVENT  meloxicam 7.5 MG tablet Commonly known as: MOBIC    New Medications at Discharge:    ondansetron 4 MG tablet Commonly known as: ZOFRAN Dose: 4 mg Instructions: Take 1 tablet (4 mg total) by mouth every 8 (eight) hours as needed for Nausea / Vomiting.  vancomycin 250 MG capsule Commonly known as: VANCOCIN Dose: 500 mg Instructions: Take 2 capsules (500 mg total) by mouth 4 times daily.   Medications with Dose Adjustments at Discharge: LORazepam 0.5 MG tablet Commonly known as: ATIVAN Take 1 tablet (0.5 mg total) by mouth 3 times/day as needed-between meals & bedtime for anxiety.    Patient was recently discharged from hospital and all medications have been reviewed.  Medication Review Findings:  Patient reported still taking Dicycloming '10mg'$  1 capsule three times daily. New script sent 11/13/21 Patient reported taking hydrocodone-APA 5-325 1 tablet every 6 hours as needed.  New script sent 11/06/21  Plan: MRP deadline was missed.    Elayne Guerin, PharmD, [place] Clinical Pharmacist [phone]

## 2021-11-27 DIAGNOSIS — C787 Secondary malignant neoplasm of liver and intrahepatic bile duct: Secondary | ICD-10-CM | POA: Diagnosis not present

## 2021-11-27 DIAGNOSIS — Z95828 Presence of other vascular implants and grafts: Secondary | ICD-10-CM | POA: Diagnosis not present

## 2021-11-27 DIAGNOSIS — Z5111 Encounter for antineoplastic chemotherapy: Secondary | ICD-10-CM | POA: Diagnosis not present

## 2021-11-27 DIAGNOSIS — C250 Malignant neoplasm of head of pancreas: Secondary | ICD-10-CM | POA: Diagnosis not present

## 2021-11-29 DIAGNOSIS — C250 Malignant neoplasm of head of pancreas: Secondary | ICD-10-CM | POA: Diagnosis not present

## 2021-11-29 DIAGNOSIS — E860 Dehydration: Secondary | ICD-10-CM | POA: Diagnosis not present

## 2021-12-04 DIAGNOSIS — C250 Malignant neoplasm of head of pancreas: Secondary | ICD-10-CM | POA: Diagnosis not present

## 2021-12-04 DIAGNOSIS — E860 Dehydration: Secondary | ICD-10-CM | POA: Diagnosis not present

## 2021-12-06 DIAGNOSIS — C250 Malignant neoplasm of head of pancreas: Secondary | ICD-10-CM | POA: Diagnosis not present

## 2021-12-06 DIAGNOSIS — C787 Secondary malignant neoplasm of liver and intrahepatic bile duct: Secondary | ICD-10-CM | POA: Diagnosis not present

## 2021-12-09 DIAGNOSIS — C250 Malignant neoplasm of head of pancreas: Secondary | ICD-10-CM | POA: Diagnosis not present

## 2021-12-09 DIAGNOSIS — R197 Diarrhea, unspecified: Secondary | ICD-10-CM | POA: Diagnosis not present

## 2021-12-09 DIAGNOSIS — C787 Secondary malignant neoplasm of liver and intrahepatic bile duct: Secondary | ICD-10-CM | POA: Diagnosis not present

## 2021-12-11 ENCOUNTER — Ambulatory Visit (INDEPENDENT_AMBULATORY_CARE_PROVIDER_SITE_OTHER): Payer: HMO | Admitting: Family Medicine

## 2021-12-11 VITALS — BP 110/76 | HR 80 | Temp 97.0°F | Resp 18 | Ht 71.0 in | Wt 184.0 lb

## 2021-12-11 DIAGNOSIS — F5101 Primary insomnia: Secondary | ICD-10-CM | POA: Diagnosis not present

## 2021-12-11 DIAGNOSIS — E860 Dehydration: Secondary | ICD-10-CM | POA: Diagnosis not present

## 2021-12-11 DIAGNOSIS — I10 Essential (primary) hypertension: Secondary | ICD-10-CM | POA: Diagnosis not present

## 2021-12-11 DIAGNOSIS — C250 Malignant neoplasm of head of pancreas: Secondary | ICD-10-CM | POA: Diagnosis not present

## 2021-12-11 DIAGNOSIS — C787 Secondary malignant neoplasm of liver and intrahepatic bile duct: Secondary | ICD-10-CM

## 2021-12-11 DIAGNOSIS — C259 Malignant neoplasm of pancreas, unspecified: Secondary | ICD-10-CM

## 2021-12-11 MED ORDER — MIRTAZAPINE 15 MG PO TABS: 15.0000 mg | ORAL_TABLET | Freq: Every day | ORAL | 0 refills | Status: DC

## 2021-12-11 NOTE — Progress Notes (Signed)
Acute Office Visit  Subjective:    [name], male    DOB: [birth year], 56 y.o.   MRN: [id number]  Chief Complaint  Patient presents with   Insomnia   HPI: Patient is in today for insomnia x 2 weeks. Poor sleep. Has tried hydroxyzine. Did not help.  Pancreatic Cancer: Scheduled to get IV fluids today at oncology. Had to have IVF last week.  Last CA19 to compare to level prior to last 2 chemo treatments. Did not tolerate chemo. Became very sick. BP low normal. Poor appetite.   Past Medical History:  Diagnosis Date   Barrett's esophagus    Cervicalgia    Essential hypertension    GERD (gastroesophageal reflux disease)    History of COVID-19    05/2019   Mixed hyperlipidemia    Pancreatic carcinoma metastatic to liver [facility])    Primary malignant neoplasm of head of pancreas [facility])     Past Surgical History:  Procedure Laterality Date   LAPAROSCOPIC CHOLECYSTECTOMY  01/05/2015   NASAL POLYP SURGERY     REFRACTIVE SURGERY Bilateral 1988   WHIPPLE PROCEDURE  11/09/2015   Pylorus sparring    Family History  Problem Relation Age of Onset   Cancer Mother        breast   Hypertension Father    Hyperlipidemia Father    Cancer Father        prostate   Hypertension Sister     Social History   Socioeconomic History   Marital status: Married    Spouse name: Not on file   Number of children: 2   Years of education: Not on file   Highest education level: Not on file  Occupational History   Occupation: Retired    Comment: Deputy[employer]  Tobacco Use   Smoking status: Former    Types: Cigars    Quit date: 2005    Years since quitting: 18.6   Smokeless tobacco: Never  Substance and Sexual Activity   Alcohol use: Not Currently   Drug use: Never   Sexual activity: Yes    Partners: Female  Other Topics Concern   Not on file  Social History Narrative   Not on file   Social Determinants of Health   Financial Resource Strain: Not on file  Food  Insecurity: Not on file  Transportation Needs: Not on file  Physical Activity: Not on file  Stress: Not on file  Social Connections: Not on file  Intimate Partner Violence: Not on file    Outpatient Medications Prior to Visit  Medication Sig Dispense Refill   acetaminophen (TYLENOL) 500 MG tablet Take 500 mg by mouth every 6 (six) hours as needed.     amLODipine (NORVASC) 5 MG tablet Take 1 tablet (5 mg total) by mouth daily. 90 tablet 1   cetirizine (ZYRTEC) 10 MG tablet Take by mouth.     CREON 36000-114000 units CPEP capsule TAKE 3 capsules with meals. 1 after starting/2 when finished eating. Take 2 capsules with snack after starting to eat.     dicyclomine (BENTYL) 10 MG capsule Take 10 mg by mouth 3 (three) times daily as needed.     ertapenem [facility]) IVPB Inject into the vein.     esomeprazole (NEXIUM) 40 MG capsule Take 1 capsule (40 mg total) by mouth daily. 90 capsule 3   gabapentin (NEURONTIN) 300 MG capsule Take 1 capsule by mouth at bedtime.     guaifenesin (HUMIBID E) 400 MG TABS tablet  Take by mouth as needed.     HYDROcodone-acetaminophen (NORCO/VICODIN) 5-325 MG tablet Take 1 tablet by mouth every 6 (six) hours as needed for moderate pain.     ibuprofen (ADVIL) 600 MG tablet Take 1 tablet (600 mg total) by mouth every 8 (eight) hours as needed. 30 tablet 1   linaclotide (LINZESS) 72 MCG capsule as needed.     losartan (COZAAR) 50 MG tablet Take 1 tablet (50 mg total) by mouth daily. 90 tablet 1   meloxicam (MOBIC) 7.5 MG tablet TAKE ONE TABLET BY MOUTH TWICE DAILY 60 tablet 11   methocarbamol (ROBAXIN) 500 MG tablet Take 500 mg by mouth 2 (two) times daily. (Patient not taking: Reported on 09/23/2021)     Multiple Vitamin (MULTIVITAMIN) capsule Take 1 capsule by mouth daily.     Omega-3 1000 MG CAPS Take by mouth.     tamsulosin (FLOMAX) 0.4 MG CAPS capsule Take 1 capsule (0.4 mg total) by mouth daily for 30 days. 30 capsule 1   Thiamine HCl (VITAMIN B-1 PO) Take by mouth.      LORazepam (ATIVAN) 0.5 MG tablet Take 1 tablet (0.5 mg total) by mouth 3 times/day as needed-between meals & bedtime for anxiety. 30 tablet 3   polyethylene glycol powder (GLYCOLAX/MIRALAX) 17 GM/SCOOP powder Take by mouth.     sulfamethoxazole-trimethoprim (BACTRIM DS) 800-160 MG tablet Take 1 tablet by mouth 2 (two) times daily.     traMADol (ULTRAM) 50 MG tablet Take by mouth.     No facility-administered medications prior to visit.    Allergies  Allergen Reactions   Trazodone And Nefazodone     Somnolence. Cloudy feeling.    Mirtazapine     Insomnia.   Other Other (See Comments)    Prefers to not have coban wrap used due to the smell causes him to be sick   Oxycodone Itching   Vancomycin Rash    Review of Systems  Constitutional:  Positive for fatigue and fever (low grade with chemotherapy). Negative for chills.  Genitourinary:  Positive for frequency.  Neurological:  Positive for dizziness and weakness.  Psychiatric/Behavioral:  Positive for sleep disturbance.        Objective:    Physical Exam Vitals reviewed.  Constitutional:      Comments: thin  Cardiovascular:     Rate and Rhythm: Normal rate and regular rhythm.     Heart sounds: Normal heart sounds.  Pulmonary:     Effort: Pulmonary effort is normal.     Breath sounds: Normal breath sounds. No wheezing, rhonchi or rales.  Abdominal:     General: Bowel sounds are normal.     Palpations: Abdomen is soft.     Tenderness: There is abdominal tenderness (generalized.).  Neurological:     Mental Status: He is alert and oriented to person, place, and time.  Psychiatric:        Mood and Affect: Mood normal.        Behavior: Behavior normal.     BP 110/76   Pulse 80   Temp (!) 97 F (36.1 C)   Resp 18   Ht 5' 11"  (1.803 m)   Wt 184 lb (83.5 kg)   BMI 25.66 kg/m  Wt Readings from Last 3 Encounters:  12/11/21 184 lb (83.5 kg)  09/25/21 204 lb (92.5 kg)  09/23/21 203 lb 6.4 oz (92.3 kg)    Health  Maintenance Due  Topic Date Due   COVID-19 Vaccine (1) Never done  HIV Screening  Never done   Hepatitis C Screening  Never done   TETANUS/TDAP  Never done   Zoster Vaccines- Shingrix (1 of 2) Never done   COLONOSCOPY (Pts 45-65yr Insurance coverage will need to be confirmed)  Never done   INFLUENZA VACCINE  12/03/2021    There are no preventive care reminders to display for this patient.   No results found for: "TSH" Lab Results  Component Value Date   WBC 3.6 08/26/2021   HGB 14.6 08/26/2021   HCT 42.5 08/26/2021   MCV 90 08/26/2021   PLT 196 08/26/2021   Lab Results  Component Value Date   NA 137 08/26/2021   K 4.6 08/26/2021   CO2 24 08/26/2021   GLUCOSE 96 08/26/2021   BUN 11 08/26/2021   CREATININE 0.92 08/26/2021   BILITOT 0.4 08/26/2021   ALKPHOS 78 08/26/2021   AST 24 08/26/2021   ALT 23 08/26/2021   PROT 6.6 08/26/2021   ALBUMIN 4.4 08/26/2021   CALCIUM 9.6 08/26/2021   EGFR 96 08/26/2021   Lab Results  Component Value Date   CHOL 171 08/26/2021   Lab Results  Component Value Date   HDL 56 08/26/2021   Lab Results  Component Value Date   LDLCALC 97 08/26/2021   Lab Results  Component Value Date   TRIG 102 08/26/2021   Lab Results  Component Value Date   CHOLHDL 3.1 08/26/2021   No results found for: "HGBA1C"     Assessment & Plan:   Problem List Items Addressed This Visit       Cardiovascular and Mediastinum   Essential hypertension, benign    Hold losartan as he is battling dehydration and does not likely need as much antihypertensive medicines.         Digestive   Pancreatic cancer metastasized to liver ([facility]    Defer to oncology.  Unfortunately, he has not been tolerating chemo.         Other   Primary insomnia    Start mirtazepine 15 mg before bed.       Meds ordered this encounter  Medications   DISCONTD: mirtazapine (REMERON) 15 MG tablet    Sig: Take 1 tablet (15 mg total) by mouth at bedtime.     Dispense:  30 tablet    Refill:  0    No orders of the defined types were placed in this encounter.    Follow-up: Return if symptoms worsen or fail to improve.  An After Visit Summary was printed and given to the patient.  KRochel Brome MD [facility] ([phone]

## 2021-12-11 NOTE — Patient Instructions (Addendum)
Discontinue losartan.  Start mirtazepine 15 mg before bed for insomnia.

## 2021-12-12 ENCOUNTER — Other Ambulatory Visit: Payer: Self-pay | Admitting: Family Medicine

## 2021-12-12 MED ORDER — LORAZEPAM 1 MG PO TABS: 1.0000 mg | ORAL_TABLET | Freq: Three times a day (TID) | ORAL | 1 refills | Status: DC | PRN

## 2021-12-18 DIAGNOSIS — M549 Dorsalgia, unspecified: Secondary | ICD-10-CM | POA: Diagnosis not present

## 2021-12-18 DIAGNOSIS — Z9049 Acquired absence of other specified parts of digestive tract: Secondary | ICD-10-CM | POA: Diagnosis not present

## 2021-12-18 DIAGNOSIS — Z9889 Other specified postprocedural states: Secondary | ICD-10-CM | POA: Diagnosis not present

## 2021-12-18 DIAGNOSIS — E860 Dehydration: Secondary | ICD-10-CM | POA: Diagnosis not present

## 2021-12-18 DIAGNOSIS — C250 Malignant neoplasm of head of pancreas: Secondary | ICD-10-CM | POA: Diagnosis not present

## 2021-12-18 DIAGNOSIS — C787 Secondary malignant neoplasm of liver and intrahepatic bile duct: Secondary | ICD-10-CM | POA: Diagnosis not present

## 2021-12-18 DIAGNOSIS — R634 Abnormal weight loss: Secondary | ICD-10-CM | POA: Diagnosis not present

## 2021-12-18 DIAGNOSIS — Z9221 Personal history of antineoplastic chemotherapy: Secondary | ICD-10-CM | POA: Diagnosis not present

## 2021-12-18 DIAGNOSIS — C259 Malignant neoplasm of pancreas, unspecified: Secondary | ICD-10-CM | POA: Diagnosis not present

## 2021-12-18 DIAGNOSIS — R630 Anorexia: Secondary | ICD-10-CM | POA: Diagnosis not present

## 2021-12-18 DIAGNOSIS — R6883 Chills (without fever): Secondary | ICD-10-CM | POA: Diagnosis not present

## 2021-12-18 DIAGNOSIS — B961 Klebsiella pneumoniae [K. pneumoniae] as the cause of diseases classified elsewhere: Secondary | ICD-10-CM | POA: Diagnosis not present

## 2021-12-18 DIAGNOSIS — R5383 Other fatigue: Secondary | ICD-10-CM | POA: Diagnosis not present

## 2021-12-18 DIAGNOSIS — R5081 Fever presenting with conditions classified elsewhere: Secondary | ICD-10-CM | POA: Diagnosis not present

## 2021-12-18 DIAGNOSIS — G893 Neoplasm related pain (acute) (chronic): Secondary | ICD-10-CM | POA: Diagnosis not present

## 2021-12-18 DIAGNOSIS — R7881 Bacteremia: Secondary | ICD-10-CM | POA: Diagnosis not present

## 2021-12-18 DIAGNOSIS — Z90411 Acquired partial absence of pancreas: Secondary | ICD-10-CM | POA: Diagnosis not present

## 2021-12-18 DIAGNOSIS — Z6825 Body mass index (BMI) 25.0-25.9, adult: Secondary | ICD-10-CM | POA: Diagnosis not present

## 2021-12-21 ENCOUNTER — Encounter: Payer: Self-pay | Admitting: Family Medicine

## 2021-12-21 DIAGNOSIS — F5101 Primary insomnia: Secondary | ICD-10-CM | POA: Insufficient documentation

## 2021-12-21 NOTE — Assessment & Plan Note (Signed)
Defer to oncology.  Unfortunately, he has not been tolerating chemo.

## 2021-12-21 NOTE — Assessment & Plan Note (Signed)
Start mirtazepine 15 mg before bed.

## 2021-12-21 NOTE — Assessment & Plan Note (Signed)
Hold losartan as he is battling dehydration and does not likely need as much antihypertensive medicines.

## 2021-12-27 ENCOUNTER — Telehealth: Payer: Self-pay

## 2021-12-27 ENCOUNTER — Ambulatory Visit (INDEPENDENT_AMBULATORY_CARE_PROVIDER_SITE_OTHER): Payer: PPO | Admitting: Family Medicine

## 2021-12-27 ENCOUNTER — Ambulatory Visit: Payer: HMO

## 2021-12-27 VITALS — BP 106/78 | HR 88 | Temp 97.2°F | Resp 16 | Ht 71.0 in | Wt 180.0 lb

## 2021-12-27 DIAGNOSIS — I10 Essential (primary) hypertension: Secondary | ICD-10-CM | POA: Diagnosis not present

## 2021-12-27 DIAGNOSIS — R000 Tachycardia, unspecified: Secondary | ICD-10-CM | POA: Insufficient documentation

## 2021-12-27 MED ORDER — LISINOPRIL 10 MG PO TABS: 10.0000 mg | ORAL_TABLET | Freq: Every day | ORAL | 0 refills | Status: DC

## 2021-12-27 NOTE — Assessment & Plan Note (Signed)
Begin lisinopril 10 mg once daily.

## 2021-12-27 NOTE — Telephone Encounter (Signed)
Patient called and stated he was taking off of lisinopril and lostarn and was told to take amlodipine 5 mg, He started having headaches on Wednesday and check his BP it was 150s/120s with a pulse of 120s for two days, he took a lisinopril about 5 this morning and BP is now 118/830 with a pulse of 105, patient state dhe no longer has an headache but his pulse rate is concerning. Please Advise.  Spoke with Hoyle Sauer Patient is coming in for BP check this morning.

## 2021-12-27 NOTE — Progress Notes (Signed)
Acute Office Visit  Subjective:    [name], male    DOB: January 07, 1966, 56 y.o.   MRN: [id number]  Chief Complaint  Patient presents with   Hypertension   Tachycardia    HPI: Patient comes in today with elevated bp and tachycardia.  His losartan was stopped at his last visit.  He has continued to take his amlodipine 5 mg daily. On Wednesday evening he developed headache after church and went to bed.  Yesterday he checked his bp several times, 150/99, 156/104, pulse 105. He took Lisinopril 20 mg this morning and his bp dropped to 118/83, pulse 105.  He has no headache now but he is concerned that his heart rate has been elevated.  Having headache and racing heart.   Past Medical History:  Diagnosis Date   Barrett's esophagus    Cervicalgia    Essential hypertension    GERD (gastroesophageal reflux disease)    History of COVID-19    05/2019   Mixed hyperlipidemia    Pancreatic carcinoma metastatic to liver [facility])    Primary malignant neoplasm of head of pancreas [facility])     Past Surgical History:  Procedure Laterality Date   LAPAROSCOPIC CHOLECYSTECTOMY  01/05/2015   NASAL POLYP SURGERY     REFRACTIVE SURGERY Bilateral 1988   WHIPPLE PROCEDURE  11/09/2015   Pylorus sparring    Family History  Problem Relation Age of Onset   Cancer Mother        breast   Hypertension Father    Hyperlipidemia Father    Cancer Father        prostate   Hypertension Sister     Social History   Socioeconomic History   Marital status: Married    Spouse name: Not on file   Number of children: 2   Years of education: Not on file   Highest education level: Not on file  Occupational History   Occupation: Retired    Comment: [employer]  Tobacco Use   Smoking status: Former    Types: Cigars    Quit date: 2005    Years since quitting: 18.6   Smokeless tobacco: Never  Substance and Sexual Activity   Alcohol use: Not Currently   Drug use: Never   Sexual activity: Yes     Partners: Female  Other Topics Concern   Not on file  Social History Narrative   Not on file   Social Determinants of Health   Financial Resource Strain: Not on file  Food Insecurity: Not on file  Transportation Needs: Not on file  Physical Activity: Not on file  Stress: Not on file  Social Connections: Not on file  Intimate Partner Violence: Not on file    Outpatient Medications Prior to Visit  Medication Sig Dispense Refill   acetaminophen (TYLENOL) 500 MG tablet Take 500 mg by mouth every 6 (six) hours as needed.     amLODipine (NORVASC) 5 MG tablet Take 1 tablet (5 mg total) by mouth daily. 90 tablet 1   cetirizine (ZYRTEC) 10 MG tablet Take by mouth.     CREON 36000-114000 units CPEP capsule TAKE 3 capsules with meals. 1 after starting/2 when finished eating. Take 2 capsules with snack after starting to eat.     dicyclomine (BENTYL) 10 MG capsule Take 10 mg by mouth 3 (three) times daily as needed.     ertapenem [facility]) IVPB Inject into the vein.     esomeprazole (NEXIUM) 40 MG capsule Take  1 capsule (40 mg total) by mouth daily. 90 capsule 3   gabapentin (NEURONTIN) 300 MG capsule Take 1 capsule by mouth at bedtime.     guaifenesin (HUMIBID E) 400 MG TABS tablet Take by mouth as needed.     HYDROcodone-acetaminophen (NORCO/VICODIN) 5-325 MG tablet Take 1 tablet by mouth every 6 (six) hours as needed for moderate pain.     ibuprofen (ADVIL) 600 MG tablet Take 1 tablet (600 mg total) by mouth every 8 (eight) hours as needed. 30 tablet 1   linaclotide (LINZESS) 72 MCG capsule as needed.     LORazepam (ATIVAN) 1 MG tablet Take 1 tablet (1 mg total) by mouth 3 (three) times daily as needed for anxiety or sedation. 90 tablet 1   meloxicam (MOBIC) 7.5 MG tablet TAKE ONE TABLET BY MOUTH TWICE DAILY 60 tablet 11   methocarbamol (ROBAXIN) 500 MG tablet Take 500 mg by mouth 2 (two) times daily. (Patient not taking: Reported on 09/23/2021)     Multiple Vitamin (MULTIVITAMIN) capsule  Take 1 capsule by mouth daily.     Omega-3 1000 MG CAPS Take by mouth.     Thiamine HCl (VITAMIN B-1 PO) Take by mouth.     losartan (COZAAR) 50 MG tablet Take 1 tablet (50 mg total) by mouth daily. 90 tablet 1   tamsulosin (FLOMAX) 0.4 MG CAPS capsule Take 1 capsule (0.4 mg total) by mouth daily for 30 days. 30 capsule 1   No facility-administered medications prior to visit.    Allergies  Allergen Reactions   Trazodone And Nefazodone     Somnolence. Cloudy feeling.    Mirtazapine     Insomnia.   Other Other (See Comments)    Prefers to not have coban wrap used due to the smell causes him to be sick   Oxycodone Itching   Vancomycin Rash    Review of Systems  Constitutional:  Positive for fatigue. Negative for chills.       Night sweats   Respiratory:  Positive for shortness of breath. Negative for cough.   Cardiovascular:  Negative for chest pain.  Neurological:  Positive for headaches.       Objective:    Physical Exam Vitals reviewed.  Constitutional:      Appearance: Normal appearance.  Cardiovascular:     Rate and Rhythm: Normal rate and regular rhythm.     Heart sounds: Normal heart sounds.  Pulmonary:     Effort: Pulmonary effort is normal.     Breath sounds: Normal breath sounds. No wheezing, rhonchi or rales.  Neurological:     Mental Status: He is alert.  Psychiatric:        Mood and Affect: Mood normal.        Behavior: Behavior normal.     BP 106/78   Pulse 88   Temp (!) 97.2 F (36.2 C)   Resp 16   Ht _0  (1.803 m)   Wt 180 lb (81.6 kg)   BMI 25.10 kg/m  Wt Readings from Last 3 Encounters:  12/27/21 180 lb (81.6 kg)  12/11/21 184 lb (83.5 kg)  09/25/21 204 lb (92.5 kg)    Health Maintenance Due  Topic Date Due   COVID-19 Vaccine (1) Never done   HIV Screening  Never done   Hepatitis C Screening  Never done   TETANUS/TDAP  Never done   Zoster Vaccines- Shingrix (1 of 2) Never done   COLONOSCOPY (Pts 45-57yr Insurance coverage will  need to  be confirmed)  Never done   INFLUENZA VACCINE  Never done    There are no preventive care reminders to display for this patient.   No results found for: "TSH" Lab Results  Component Value Date   WBC 3.6 08/26/2021   HGB 14.6 08/26/2021   HCT 42.5 08/26/2021   MCV 90 08/26/2021   PLT 196 08/26/2021   Lab Results  Component Value Date   NA 137 08/26/2021   K 4.6 08/26/2021   CO2 24 08/26/2021   GLUCOSE 96 08/26/2021   BUN 11 08/26/2021   CREATININE 0.92 08/26/2021   BILITOT 0.4 08/26/2021   ALKPHOS 78 08/26/2021   AST 24 08/26/2021   ALT 23 08/26/2021   PROT 6.6 08/26/2021   ALBUMIN 4.4 08/26/2021   CALCIUM 9.6 08/26/2021   EGFR 96 08/26/2021   Lab Results  Component Value Date   CHOL 171 08/26/2021   Lab Results  Component Value Date   HDL 56 08/26/2021   Lab Results  Component Value Date   LDLCALC 97 08/26/2021   Lab Results  Component Value Date   TRIG 102 08/26/2021   Lab Results  Component Value Date   CHOLHDL 3.1 08/26/2021   No results found for: "HGBA1C"     Assessment & Plan:   Problem List Items Addressed This Visit       Cardiovascular and Mediastinum   Essential hypertension, benign - Primary    Begin lisinopril 10 mg once daily.       Relevant Medications   lisinopril (ZESTRIL) 10 MG tablet     Other   Tachycardia    Improved.      Meds ordered this encounter  Medications   lisinopril (ZESTRIL) 10 MG tablet    Sig: Take 1 tablet (10 mg total) by mouth daily.    Dispense:  90 tablet    Refill:  0     Follow-up: Return in about 4 weeks (around 01/24/2022), or if symptoms worsen or fail to improve.  An After Visit Summary was printed and given to the patient.  Rochel Brome, MD [facility] [phone]

## 2022-01-01 DIAGNOSIS — R509 Fever, unspecified: Secondary | ICD-10-CM | POA: Diagnosis not present

## 2022-01-01 DIAGNOSIS — R531 Weakness: Secondary | ICD-10-CM | POA: Diagnosis not present

## 2022-01-01 DIAGNOSIS — Z6825 Body mass index (BMI) 25.0-25.9, adult: Secondary | ICD-10-CM | POA: Diagnosis not present

## 2022-01-01 DIAGNOSIS — R634 Abnormal weight loss: Secondary | ICD-10-CM | POA: Diagnosis not present

## 2022-01-01 DIAGNOSIS — Z9221 Personal history of antineoplastic chemotherapy: Secondary | ICD-10-CM | POA: Diagnosis not present

## 2022-01-01 DIAGNOSIS — Z79633 Long term (current) use of mitotic inhibitor: Secondary | ICD-10-CM | POA: Diagnosis not present

## 2022-01-01 DIAGNOSIS — C787 Secondary malignant neoplasm of liver and intrahepatic bile duct: Secondary | ICD-10-CM | POA: Diagnosis not present

## 2022-01-01 DIAGNOSIS — Z923 Personal history of irradiation: Secondary | ICD-10-CM | POA: Diagnosis not present

## 2022-01-01 DIAGNOSIS — Z9889 Other specified postprocedural states: Secondary | ICD-10-CM | POA: Diagnosis not present

## 2022-01-01 DIAGNOSIS — E860 Dehydration: Secondary | ICD-10-CM | POA: Diagnosis not present

## 2022-01-01 DIAGNOSIS — Z79631 Long term (current) use of antimetabolite agent: Secondary | ICD-10-CM | POA: Diagnosis not present

## 2022-01-01 DIAGNOSIS — R7881 Bacteremia: Secondary | ICD-10-CM | POA: Diagnosis not present

## 2022-01-01 DIAGNOSIS — R630 Anorexia: Secondary | ICD-10-CM | POA: Diagnosis not present

## 2022-01-01 DIAGNOSIS — B961 Klebsiella pneumoniae [K. pneumoniae] as the cause of diseases classified elsewhere: Secondary | ICD-10-CM | POA: Diagnosis not present

## 2022-01-01 DIAGNOSIS — Z5111 Encounter for antineoplastic chemotherapy: Secondary | ICD-10-CM | POA: Diagnosis not present

## 2022-01-01 DIAGNOSIS — C250 Malignant neoplasm of head of pancreas: Secondary | ICD-10-CM | POA: Diagnosis not present

## 2022-01-01 DIAGNOSIS — Z79899 Other long term (current) drug therapy: Secondary | ICD-10-CM | POA: Diagnosis not present

## 2022-01-01 DIAGNOSIS — Z90411 Acquired partial absence of pancreas: Secondary | ICD-10-CM | POA: Diagnosis not present

## 2022-01-01 DIAGNOSIS — G893 Neoplasm related pain (acute) (chronic): Secondary | ICD-10-CM | POA: Diagnosis not present

## 2022-01-01 DIAGNOSIS — R197 Diarrhea, unspecified: Secondary | ICD-10-CM | POA: Diagnosis not present

## 2022-01-03 ENCOUNTER — Encounter: Payer: Self-pay | Admitting: Family Medicine

## 2022-01-03 ENCOUNTER — Other Ambulatory Visit: Payer: Self-pay

## 2022-01-03 MED ORDER — TAMSULOSIN HCL 0.4 MG PO CAPS
0.4000 mg | ORAL_CAPSULE | Freq: Every day | ORAL | 1 refills | Status: DC
Start: 2022-01-03 — End: 2022-06-15

## 2022-01-03 NOTE — Assessment & Plan Note (Signed)
Improved

## 2022-01-07 DIAGNOSIS — E860 Dehydration: Secondary | ICD-10-CM | POA: Diagnosis not present

## 2022-01-07 DIAGNOSIS — C787 Secondary malignant neoplasm of liver and intrahepatic bile duct: Secondary | ICD-10-CM | POA: Diagnosis not present

## 2022-01-07 DIAGNOSIS — C250 Malignant neoplasm of head of pancreas: Secondary | ICD-10-CM | POA: Diagnosis not present

## 2022-01-09 DIAGNOSIS — C787 Secondary malignant neoplasm of liver and intrahepatic bile duct: Secondary | ICD-10-CM | POA: Diagnosis not present

## 2022-01-09 DIAGNOSIS — Z515 Encounter for palliative care: Secondary | ICD-10-CM | POA: Diagnosis not present

## 2022-01-09 DIAGNOSIS — E860 Dehydration: Secondary | ICD-10-CM | POA: Diagnosis not present

## 2022-01-09 DIAGNOSIS — Z79631 Long term (current) use of antimetabolite agent: Secondary | ICD-10-CM | POA: Diagnosis not present

## 2022-01-09 DIAGNOSIS — Z79633 Long term (current) use of mitotic inhibitor: Secondary | ICD-10-CM | POA: Diagnosis not present

## 2022-01-09 DIAGNOSIS — T451X5A Adverse effect of antineoplastic and immunosuppressive drugs, initial encounter: Secondary | ICD-10-CM | POA: Diagnosis not present

## 2022-01-09 DIAGNOSIS — C250 Malignant neoplasm of head of pancreas: Secondary | ICD-10-CM | POA: Diagnosis not present

## 2022-01-09 DIAGNOSIS — Z79899 Other long term (current) drug therapy: Secondary | ICD-10-CM | POA: Diagnosis not present

## 2022-01-09 DIAGNOSIS — E871 Hypo-osmolality and hyponatremia: Secondary | ICD-10-CM | POA: Diagnosis not present

## 2022-01-09 DIAGNOSIS — R112 Nausea with vomiting, unspecified: Secondary | ICD-10-CM | POA: Diagnosis not present

## 2022-01-09 DIAGNOSIS — Z9221 Personal history of antineoplastic chemotherapy: Secondary | ICD-10-CM | POA: Diagnosis not present

## 2022-01-10 DIAGNOSIS — T82898A Other specified complication of vascular prosthetic devices, implants and grafts, initial encounter: Secondary | ICD-10-CM | POA: Diagnosis not present

## 2022-01-10 DIAGNOSIS — E860 Dehydration: Secondary | ICD-10-CM | POA: Diagnosis not present

## 2022-01-15 DIAGNOSIS — Z79631 Long term (current) use of antimetabolite agent: Secondary | ICD-10-CM | POA: Diagnosis not present

## 2022-01-15 DIAGNOSIS — Z6824 Body mass index (BMI) 24.0-24.9, adult: Secondary | ICD-10-CM | POA: Diagnosis not present

## 2022-01-15 DIAGNOSIS — Z9889 Other specified postprocedural states: Secondary | ICD-10-CM | POA: Diagnosis not present

## 2022-01-15 DIAGNOSIS — R634 Abnormal weight loss: Secondary | ICD-10-CM | POA: Diagnosis not present

## 2022-01-15 DIAGNOSIS — Z9221 Personal history of antineoplastic chemotherapy: Secondary | ICD-10-CM | POA: Diagnosis not present

## 2022-01-15 DIAGNOSIS — T451X5A Adverse effect of antineoplastic and immunosuppressive drugs, initial encounter: Secondary | ICD-10-CM | POA: Diagnosis not present

## 2022-01-15 DIAGNOSIS — Z923 Personal history of irradiation: Secondary | ICD-10-CM | POA: Diagnosis not present

## 2022-01-15 DIAGNOSIS — Z90411 Acquired partial absence of pancreas: Secondary | ICD-10-CM | POA: Diagnosis not present

## 2022-01-15 DIAGNOSIS — T451X5D Adverse effect of antineoplastic and immunosuppressive drugs, subsequent encounter: Secondary | ICD-10-CM | POA: Diagnosis not present

## 2022-01-15 DIAGNOSIS — Z79899 Other long term (current) drug therapy: Secondary | ICD-10-CM | POA: Diagnosis not present

## 2022-01-15 DIAGNOSIS — C787 Secondary malignant neoplasm of liver and intrahepatic bile duct: Secondary | ICD-10-CM | POA: Diagnosis not present

## 2022-01-15 DIAGNOSIS — C250 Malignant neoplasm of head of pancreas: Secondary | ICD-10-CM | POA: Diagnosis not present

## 2022-01-15 DIAGNOSIS — Z9049 Acquired absence of other specified parts of digestive tract: Secondary | ICD-10-CM | POA: Diagnosis not present

## 2022-01-15 DIAGNOSIS — R7881 Bacteremia: Secondary | ICD-10-CM | POA: Diagnosis not present

## 2022-01-15 DIAGNOSIS — B961 Klebsiella pneumoniae [K. pneumoniae] as the cause of diseases classified elsewhere: Secondary | ICD-10-CM | POA: Diagnosis not present

## 2022-01-15 DIAGNOSIS — E871 Hypo-osmolality and hyponatremia: Secondary | ICD-10-CM | POA: Diagnosis not present

## 2022-01-15 DIAGNOSIS — C259 Malignant neoplasm of pancreas, unspecified: Secondary | ICD-10-CM | POA: Diagnosis not present

## 2022-01-15 DIAGNOSIS — G893 Neoplasm related pain (acute) (chronic): Secondary | ICD-10-CM | POA: Diagnosis not present

## 2022-01-15 DIAGNOSIS — R630 Anorexia: Secondary | ICD-10-CM | POA: Diagnosis not present

## 2022-01-15 DIAGNOSIS — G620 Drug-induced polyneuropathy: Secondary | ICD-10-CM | POA: Diagnosis not present

## 2022-01-15 DIAGNOSIS — E860 Dehydration: Secondary | ICD-10-CM | POA: Diagnosis not present

## 2022-01-15 DIAGNOSIS — Z5111 Encounter for antineoplastic chemotherapy: Secondary | ICD-10-CM | POA: Diagnosis not present

## 2022-01-15 DIAGNOSIS — Z79633 Long term (current) use of mitotic inhibitor: Secondary | ICD-10-CM | POA: Diagnosis not present

## 2022-01-22 DIAGNOSIS — C250 Malignant neoplasm of head of pancreas: Secondary | ICD-10-CM | POA: Diagnosis not present

## 2022-01-22 DIAGNOSIS — E871 Hypo-osmolality and hyponatremia: Secondary | ICD-10-CM | POA: Diagnosis not present

## 2022-01-22 DIAGNOSIS — C787 Secondary malignant neoplasm of liver and intrahepatic bile duct: Secondary | ICD-10-CM | POA: Diagnosis not present

## 2022-01-22 DIAGNOSIS — C259 Malignant neoplasm of pancreas, unspecified: Secondary | ICD-10-CM | POA: Diagnosis not present

## 2022-01-28 ENCOUNTER — Ambulatory Visit (INDEPENDENT_AMBULATORY_CARE_PROVIDER_SITE_OTHER): Payer: PPO | Admitting: Family Medicine

## 2022-01-28 ENCOUNTER — Encounter: Payer: Self-pay | Admitting: Family Medicine

## 2022-01-28 VITALS — BP 110/68 | HR 86 | Temp 97.4°F | Ht 71.0 in | Wt 174.0 lb

## 2022-01-28 DIAGNOSIS — R112 Nausea with vomiting, unspecified: Secondary | ICD-10-CM

## 2022-01-28 DIAGNOSIS — K8689 Other specified diseases of pancreas: Secondary | ICD-10-CM

## 2022-01-28 DIAGNOSIS — C787 Secondary malignant neoplasm of liver and intrahepatic bile duct: Secondary | ICD-10-CM

## 2022-01-28 DIAGNOSIS — I10 Essential (primary) hypertension: Secondary | ICD-10-CM

## 2022-01-28 DIAGNOSIS — C259 Malignant neoplasm of pancreas, unspecified: Secondary | ICD-10-CM | POA: Diagnosis not present

## 2022-01-28 DIAGNOSIS — E871 Hypo-osmolality and hyponatremia: Secondary | ICD-10-CM

## 2022-01-28 DIAGNOSIS — E43 Unspecified severe protein-calorie malnutrition: Secondary | ICD-10-CM

## 2022-01-28 MED ORDER — LISINOPRIL 20 MG PO TABS: 20.0000 mg | ORAL_TABLET | Freq: Every day | ORAL | 1 refills | Status: DC

## 2022-01-28 MED ORDER — HYDROXYZINE PAMOATE 25 MG PO CAPS: 25.0000 mg | ORAL_CAPSULE | Freq: Two times a day (BID) | ORAL | 0 refills | Status: DC | PRN

## 2022-01-28 MED ORDER — AMLODIPINE BESYLATE 10 MG PO TABS: 10.0000 mg | ORAL_TABLET | Freq: Every day | ORAL | 1 refills | Status: DC

## 2022-01-28 MED ORDER — QUERCETIN COMPLEX IMMUNE PO CAPS: 1.0000 | ORAL_CAPSULE | Freq: Every day | ORAL | 0 refills | Status: DC

## 2022-01-28 NOTE — Progress Notes (Unsigned)
Subjective:  [name], male    DOB: 13-Apr-1966  Age: 56 y.o. 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  Patient is in supportive care/palliative care. He is taking a different type of chemo that he is tolerating better. Patient has been in bed a lot. Trying to eat more. Taking salt tablets 1000 mg daily. Patient feels better. Sodium has improved. ON lisinopril 20 mg daily and amlodipine 10 mg daily and it is helping his bp. He takes zofran 8 mg three times a day as needed for nausea.  Last week wasn't feeling good and did a panel and ekg. All was normal. Patient had cut sodium tablets back and they told him to go back up. He is trying another course of chemotherapy.   In addition he is requesting a refill of lorazepam.  He takes 0.5 mg nightly to calm his mind so he can go to sleep. Also take 0.5 mg 30 minutes before chemotherapy.    Current Outpatient Medications on File Prior to Visit  Medication Sig Dispense Refill   acetaminophen (TYLENOL) 500 MG tablet Take 500 mg by mouth every 6 (six) hours as needed.     CREON 36000-114000 units CPEP capsule TAKE 3 capsules with meals. 1 after starting/2 when finished eating. Take 2 capsules with snack after starting to eat.     dicyclomine (BENTYL) 10 MG capsule Take 10 mg by mouth 3 (three) times daily as needed.     ertapenem [facility]) IVPB Inject into the vein.     esomeprazole (NEXIUM) 40 MG capsule Take 1 capsule (40 mg total) by mouth daily. 90 capsule 3   gabapentin (NEURONTIN) 300 MG capsule Take 1 capsule by mouth at bedtime.     guaifenesin (HUMIBID E) 400 MG TABS tablet Take by mouth as needed.     HYDROcodone-acetaminophen (NORCO/VICODIN) 5-325 MG tablet Take 1 tablet by mouth every 6 (six) hours as needed for moderate pain.     ibuprofen (ADVIL) 600 MG tablet Take 1 tablet (600 mg total) by mouth every 8 (eight) hours as needed.  30 tablet 1   linaclotide (LINZESS) 72 MCG capsule as needed.     LORazepam (ATIVAN) 1 MG tablet Take 1 tablet (1 mg total) by mouth 3 (three) times daily as needed for anxiety or sedation. 90 tablet 1   meloxicam (MOBIC) 7.5 MG tablet TAKE ONE TABLET BY MOUTH TWICE DAILY 60 tablet 11   methocarbamol (ROBAXIN) 500 MG tablet Take 500 mg by mouth 2 (two) times daily.     Multiple Vitamin (MULTIVITAMIN) capsule Take 1 capsule by mouth daily.     Omega-3 1000 MG CAPS Take by mouth.     SODIUM CHLORIDE PO Take 1,000 mg by mouth daily.     tamsulosin (FLOMAX) 0.4 MG CAPS capsule Take 1 capsule (0.4 mg total) by mouth daily. 90 capsule 1   Thiamine HCl (VITAMIN B-1 PO) Take by mouth.     No current facility-administered medications on file prior to visit.   Past Medical History:  Diagnosis Date   Barrett's esophagus    Cervicalgia    Essential hypertension    GERD (gastroesophageal reflux disease)    History of COVID-19    05/2019   Mixed hyperlipidemia    Pancreatic carcinoma metastatic to liver [facility])    Primary malignant neoplasm of head of pancreas ([place] [facility])  Past Surgical History:  Procedure Laterality Date   LAPAROSCOPIC CHOLECYSTECTOMY  01/05/2015   NASAL POLYP SURGERY     REFRACTIVE SURGERY Bilateral 1988   WHIPPLE PROCEDURE  11/09/2015   Pylorus sparring    Family History  Problem Relation Age of Onset   Cancer Mother        breast   Hypertension Father    Hyperlipidemia Father    Cancer Father        prostate   Hypertension Sister    Social History   Socioeconomic History   Marital status: Married    Spouse name: Not on file   Number of children: 2   Years of education: Not on file   Highest education level: Not on file  Occupational History   Occupation: Retired    Comment: [employer]  Tobacco Use   Smoking status: Former    Types: Cigars    Quit date: 2005    Years since quitting: 18.7   Smokeless tobacco: Never  Substance and Sexual Activity   Alcohol  use: Not Currently   Drug use: Never   Sexual activity: Yes    Partners: Female  Other Topics Concern   Not on file  Social History Narrative   Not on file   Social Determinants of Health   Financial Resource Strain: Not on file  Food Insecurity: Not on file  Transportation Needs: Not on file  Physical Activity: Not on file  Stress: Not on file  Social Connections: Not on file    Review of Systems  Constitutional:  Negative for appetite change, fatigue and fever.  HENT:  Negative for congestion, ear pain, sinus pressure and sore throat.   Respiratory:  Negative for cough, chest tightness, shortness of breath and wheezing.   Cardiovascular:  Negative for chest pain and palpitations.  Gastrointestinal:  Positive for vomiting (due to chemo treatment). Negative for abdominal pain, constipation, diarrhea and nausea.  Genitourinary:  Negative for dysuria and hematuria.  Musculoskeletal:  Negative for arthralgias, back pain, joint swelling and myalgias.  Skin:  Negative for rash.  Neurological:  Negative for dizziness, weakness and headaches.  Psychiatric/Behavioral:  Negative for dysphoric mood. The patient is not nervous/anxious.      Objective:  BP 110/68 (BP Location: Left Arm, Patient Position: Sitting)   Pulse 86   Temp (!) 97.4 F (36.3 C) (Temporal)   Ht '5\' 11"'$  (1.803 m)   Wt 174 lb (78.9 kg)   SpO2 98%   BMI 24.27 kg/m      01/28/2022    8:43 AM 12/27/2021   10:03 AM 12/11/2021    9:51 AM  BP/Weight  Systolic BP 387 564 332  Diastolic BP 68 78 76  Wt. (Lbs) 174 180 184  BMI 24.27 kg/m2 25.1 kg/m2 25.66 kg/m2    Physical Exam Vitals reviewed.  Constitutional:      Appearance: Normal appearance. He is normal weight.     Comments: thin  Cardiovascular:     Rate and Rhythm: Normal rate and regular rhythm.     Heart sounds: Normal heart sounds.  Pulmonary:     Effort: Pulmonary effort is normal.     Breath sounds: Normal breath sounds.  Abdominal:      General: Abdomen is flat. Bowel sounds are normal.     Palpations: Abdomen is soft.     Tenderness: There is abdominal tenderness (generalized. mild.).  Neurological:     Mental Status: He is alert and oriented to person, place,  and time.  Psychiatric:        Mood and Affect: Mood normal.        Behavior: Behavior normal.     Diabetic Foot Exam - Simple   No data filed      Lab Results  Component Value Date   WBC 3.6 08/26/2021   HGB 14.6 08/26/2021   HCT 42.5 08/26/2021   PLT 196 08/26/2021   GLUCOSE 96 08/26/2021   CHOL 171 08/26/2021   TRIG 102 08/26/2021   HDL 56 08/26/2021   LDLCALC 97 08/26/2021   ALT 23 08/26/2021   AST 24 08/26/2021   NA 137 08/26/2021   K 4.6 08/26/2021   CL 99 08/26/2021   CREATININE 0.92 08/26/2021   BUN 11 08/26/2021   CO2 24 08/26/2021      Assessment & Plan:   Problem List Items Addressed This Visit       Cardiovascular and Mediastinum   Essential hypertension, benign - Primary    The current medical regimen is effective;  continue present plan and medications. Continue lisinopril 20 mg daily and amlodipine 10 mg daily.       Relevant Medications   amLODipine (NORVASC) 10 MG tablet   lisinopril (ZESTRIL) 20 MG tablet     Digestive   Pancreatic cancer metastasized to liver [facility])    Continue with oncology.  Receiving Palliative treatment.        Pancreatic insufficiency    Continue creon.      Nausea and vomiting   Relevant Medications   hydrOXYzine (VISTARIL) 25 MG capsule     Other   Hyponatremia    Continue sodium tablets.       Relevant Medications   SODIUM CHLORIDE PO   Severe protein-calorie malnutrition [employer]iler: less than 60% of standard weight) ([place])    Encouraged to eat three meals per day. Recommend protein drinks as tolerated.      .  Meds ordered this encounter  Medications   amLODipine (NORVASC) 10 MG tablet    Sig: Take 1 tablet (10 mg total) by mouth daily.    Dispense:  90 tablet     Refill:  1    This prescription was filled on 08/12/2021. Any refills authorized will be placed on file.   lisinopril (ZESTRIL) 20 MG tablet    Sig: Take 1 tablet (20 mg total) by mouth daily.    Dispense:  90 tablet    Refill:  1   Bioflavonoid Products (QUERCETIN COMPLEX IMMUNE) CAPS    Sig: Take 1 tablet by mouth daily.    Dispense:  1 capsule    Refill:  0   hydrOXYzine (VISTARIL) 25 MG capsule    Sig: Take 1 capsule (25 mg total) by mouth 2 (two) times daily as needed for nausea or vomiting.    Dispense:  1 capsule    Refill:  0    Follow-up: Return in about 3 months (around 04/29/2022) for chronic follow up.  An After Visit Summary was printed and given to the patient.   Rochel Brome, MD [facility] [phone]

## 2022-01-29 DIAGNOSIS — R7881 Bacteremia: Secondary | ICD-10-CM | POA: Diagnosis not present

## 2022-01-29 DIAGNOSIS — C787 Secondary malignant neoplasm of liver and intrahepatic bile duct: Secondary | ICD-10-CM | POA: Diagnosis not present

## 2022-01-29 DIAGNOSIS — K8689 Other specified diseases of pancreas: Secondary | ICD-10-CM | POA: Diagnosis not present

## 2022-01-29 DIAGNOSIS — Z9049 Acquired absence of other specified parts of digestive tract: Secondary | ICD-10-CM | POA: Diagnosis not present

## 2022-01-29 DIAGNOSIS — Z923 Personal history of irradiation: Secondary | ICD-10-CM | POA: Diagnosis not present

## 2022-01-29 DIAGNOSIS — G620 Drug-induced polyneuropathy: Secondary | ICD-10-CM | POA: Diagnosis not present

## 2022-01-29 DIAGNOSIS — Z79899 Other long term (current) drug therapy: Secondary | ICD-10-CM | POA: Diagnosis not present

## 2022-01-29 DIAGNOSIS — T451X5A Adverse effect of antineoplastic and immunosuppressive drugs, initial encounter: Secondary | ICD-10-CM | POA: Diagnosis not present

## 2022-01-29 DIAGNOSIS — Z95828 Presence of other vascular implants and grafts: Secondary | ICD-10-CM | POA: Diagnosis not present

## 2022-01-29 DIAGNOSIS — J310 Chronic rhinitis: Secondary | ICD-10-CM | POA: Diagnosis not present

## 2022-01-29 DIAGNOSIS — C259 Malignant neoplasm of pancreas, unspecified: Secondary | ICD-10-CM | POA: Diagnosis not present

## 2022-01-29 DIAGNOSIS — Z9221 Personal history of antineoplastic chemotherapy: Secondary | ICD-10-CM | POA: Diagnosis not present

## 2022-01-29 DIAGNOSIS — C250 Malignant neoplasm of head of pancreas: Secondary | ICD-10-CM | POA: Diagnosis not present

## 2022-01-29 DIAGNOSIS — G893 Neoplasm related pain (acute) (chronic): Secondary | ICD-10-CM | POA: Diagnosis not present

## 2022-01-29 DIAGNOSIS — E871 Hypo-osmolality and hyponatremia: Secondary | ICD-10-CM | POA: Diagnosis not present

## 2022-01-29 DIAGNOSIS — J329 Chronic sinusitis, unspecified: Secondary | ICD-10-CM | POA: Diagnosis not present

## 2022-01-29 DIAGNOSIS — Z5111 Encounter for antineoplastic chemotherapy: Secondary | ICD-10-CM | POA: Diagnosis not present

## 2022-01-29 DIAGNOSIS — R634 Abnormal weight loss: Secondary | ICD-10-CM | POA: Diagnosis not present

## 2022-01-29 DIAGNOSIS — Z9889 Other specified postprocedural states: Secondary | ICD-10-CM | POA: Diagnosis not present

## 2022-01-29 DIAGNOSIS — Z90411 Acquired partial absence of pancreas: Secondary | ICD-10-CM | POA: Diagnosis not present

## 2022-01-29 DIAGNOSIS — B961 Klebsiella pneumoniae [K. pneumoniae] as the cause of diseases classified elsewhere: Secondary | ICD-10-CM | POA: Diagnosis not present

## 2022-01-29 DIAGNOSIS — A0472 Enterocolitis due to Clostridium difficile, not specified as recurrent: Secondary | ICD-10-CM | POA: Diagnosis not present

## 2022-01-30 DIAGNOSIS — R112 Nausea with vomiting, unspecified: Secondary | ICD-10-CM | POA: Insufficient documentation

## 2022-01-30 DIAGNOSIS — E43 Unspecified severe protein-calorie malnutrition: Secondary | ICD-10-CM | POA: Insufficient documentation

## 2022-01-30 DIAGNOSIS — E871 Hypo-osmolality and hyponatremia: Secondary | ICD-10-CM | POA: Insufficient documentation

## 2022-01-30 NOTE — Assessment & Plan Note (Signed)
Continue creon.  

## 2022-01-30 NOTE — Assessment & Plan Note (Addendum)
>>  ASSESSMENT AND PLAN FOR PANCREATIC CANCER METASTASIZED TO LIVER ([place]) WRITTEN ON 01/30/2022  9:16 PM BY Rasheedah Reis, MD  Continue with oncology.  Receiving Palliative treatment.    >>ASSESSMENT AND PLAN FOR PANCREATIC INSUFFICIENCY WRITTEN ON 01/30/2022  9:14 PM BY Malarie Tappen, MD  Continue creon.

## 2022-01-30 NOTE — Assessment & Plan Note (Signed)
Encouraged to eat three meals per day. Recommend protein drinks as tolerated.

## 2022-01-30 NOTE — Assessment & Plan Note (Signed)
The current medical regimen is effective;  continue present plan and medications. Continue lisinopril 20 mg daily and amlodipine 10 mg daily.

## 2022-01-30 NOTE — Assessment & Plan Note (Signed)
Continue sodium tablets.

## 2022-02-10 DIAGNOSIS — N400 Enlarged prostate without lower urinary tract symptoms: Secondary | ICD-10-CM | POA: Diagnosis not present

## 2022-02-10 DIAGNOSIS — K769 Liver disease, unspecified: Secondary | ICD-10-CM | POA: Diagnosis not present

## 2022-02-10 DIAGNOSIS — R932 Abnormal findings on diagnostic imaging of liver and biliary tract: Secondary | ICD-10-CM | POA: Diagnosis not present

## 2022-02-10 DIAGNOSIS — C787 Secondary malignant neoplasm of liver and intrahepatic bile duct: Secondary | ICD-10-CM | POA: Diagnosis not present

## 2022-02-10 DIAGNOSIS — Z9889 Other specified postprocedural states: Secondary | ICD-10-CM | POA: Diagnosis not present

## 2022-02-10 DIAGNOSIS — Z90411 Acquired partial absence of pancreas: Secondary | ICD-10-CM | POA: Diagnosis not present

## 2022-02-10 DIAGNOSIS — C258 Malignant neoplasm of overlapping sites of pancreas: Secondary | ICD-10-CM | POA: Diagnosis not present

## 2022-02-10 DIAGNOSIS — C257 Malignant neoplasm of other parts of pancreas: Secondary | ICD-10-CM | POA: Diagnosis not present

## 2022-02-12 DIAGNOSIS — K8689 Other specified diseases of pancreas: Secondary | ICD-10-CM | POA: Diagnosis not present

## 2022-02-12 DIAGNOSIS — R634 Abnormal weight loss: Secondary | ICD-10-CM | POA: Diagnosis not present

## 2022-02-12 DIAGNOSIS — C787 Secondary malignant neoplasm of liver and intrahepatic bile duct: Secondary | ICD-10-CM | POA: Diagnosis not present

## 2022-02-12 DIAGNOSIS — R630 Anorexia: Secondary | ICD-10-CM | POA: Diagnosis not present

## 2022-02-12 DIAGNOSIS — R0981 Nasal congestion: Secondary | ICD-10-CM | POA: Diagnosis not present

## 2022-02-12 DIAGNOSIS — R112 Nausea with vomiting, unspecified: Secondary | ICD-10-CM | POA: Diagnosis not present

## 2022-02-12 DIAGNOSIS — Z923 Personal history of irradiation: Secondary | ICD-10-CM | POA: Diagnosis not present

## 2022-02-12 DIAGNOSIS — Z9221 Personal history of antineoplastic chemotherapy: Secondary | ICD-10-CM | POA: Diagnosis not present

## 2022-02-12 DIAGNOSIS — G620 Drug-induced polyneuropathy: Secondary | ICD-10-CM | POA: Diagnosis not present

## 2022-02-12 DIAGNOSIS — R7881 Bacteremia: Secondary | ICD-10-CM | POA: Diagnosis not present

## 2022-02-12 DIAGNOSIS — T451X5D Adverse effect of antineoplastic and immunosuppressive drugs, subsequent encounter: Secondary | ICD-10-CM | POA: Diagnosis not present

## 2022-02-12 DIAGNOSIS — Z90411 Acquired partial absence of pancreas: Secondary | ICD-10-CM | POA: Diagnosis not present

## 2022-02-12 DIAGNOSIS — R350 Frequency of micturition: Secondary | ICD-10-CM | POA: Diagnosis not present

## 2022-02-12 DIAGNOSIS — T451X5A Adverse effect of antineoplastic and immunosuppressive drugs, initial encounter: Secondary | ICD-10-CM | POA: Diagnosis not present

## 2022-02-12 DIAGNOSIS — R3911 Hesitancy of micturition: Secondary | ICD-10-CM | POA: Diagnosis not present

## 2022-02-12 DIAGNOSIS — R059 Cough, unspecified: Secondary | ICD-10-CM | POA: Diagnosis not present

## 2022-02-12 DIAGNOSIS — R0982 Postnasal drip: Secondary | ICD-10-CM | POA: Diagnosis not present

## 2022-02-12 DIAGNOSIS — Z5111 Encounter for antineoplastic chemotherapy: Secondary | ICD-10-CM | POA: Diagnosis not present

## 2022-02-12 DIAGNOSIS — Z6824 Body mass index (BMI) 24.0-24.9, adult: Secondary | ICD-10-CM | POA: Diagnosis not present

## 2022-02-12 DIAGNOSIS — Z8619 Personal history of other infectious and parasitic diseases: Secondary | ICD-10-CM | POA: Diagnosis not present

## 2022-02-12 DIAGNOSIS — Z79899 Other long term (current) drug therapy: Secondary | ICD-10-CM | POA: Diagnosis not present

## 2022-02-12 DIAGNOSIS — E871 Hypo-osmolality and hyponatremia: Secondary | ICD-10-CM | POA: Diagnosis not present

## 2022-02-12 DIAGNOSIS — N401 Enlarged prostate with lower urinary tract symptoms: Secondary | ICD-10-CM | POA: Diagnosis not present

## 2022-02-12 DIAGNOSIS — C250 Malignant neoplasm of head of pancreas: Secondary | ICD-10-CM | POA: Diagnosis not present

## 2022-02-12 DIAGNOSIS — Z9889 Other specified postprocedural states: Secondary | ICD-10-CM | POA: Diagnosis not present

## 2022-02-21 ENCOUNTER — Other Ambulatory Visit: Payer: Self-pay | Admitting: Family Medicine

## 2022-02-26 DIAGNOSIS — N401 Enlarged prostate with lower urinary tract symptoms: Secondary | ICD-10-CM | POA: Diagnosis not present

## 2022-02-26 DIAGNOSIS — R350 Frequency of micturition: Secondary | ICD-10-CM | POA: Diagnosis not present

## 2022-02-26 DIAGNOSIS — E871 Hypo-osmolality and hyponatremia: Secondary | ICD-10-CM | POA: Diagnosis not present

## 2022-02-26 DIAGNOSIS — C259 Malignant neoplasm of pancreas, unspecified: Secondary | ICD-10-CM | POA: Diagnosis not present

## 2022-02-26 DIAGNOSIS — Z79899 Other long term (current) drug therapy: Secondary | ICD-10-CM | POA: Diagnosis not present

## 2022-02-26 DIAGNOSIS — A0471 Enterocolitis due to Clostridium difficile, recurrent: Secondary | ICD-10-CM | POA: Diagnosis not present

## 2022-02-26 DIAGNOSIS — C787 Secondary malignant neoplasm of liver and intrahepatic bile duct: Secondary | ICD-10-CM | POA: Diagnosis not present

## 2022-02-26 DIAGNOSIS — B961 Klebsiella pneumoniae [K. pneumoniae] as the cause of diseases classified elsewhere: Secondary | ICD-10-CM | POA: Diagnosis not present

## 2022-02-26 DIAGNOSIS — C250 Malignant neoplasm of head of pancreas: Secondary | ICD-10-CM | POA: Diagnosis not present

## 2022-02-26 DIAGNOSIS — G893 Neoplasm related pain (acute) (chronic): Secondary | ICD-10-CM | POA: Diagnosis not present

## 2022-03-14 DIAGNOSIS — R112 Nausea with vomiting, unspecified: Secondary | ICD-10-CM | POA: Diagnosis not present

## 2022-03-14 DIAGNOSIS — R079 Chest pain, unspecified: Secondary | ICD-10-CM | POA: Diagnosis not present

## 2022-03-14 DIAGNOSIS — E871 Hypo-osmolality and hyponatremia: Secondary | ICD-10-CM | POA: Diagnosis not present

## 2022-03-14 DIAGNOSIS — G620 Drug-induced polyneuropathy: Secondary | ICD-10-CM | POA: Diagnosis not present

## 2022-03-14 DIAGNOSIS — C259 Malignant neoplasm of pancreas, unspecified: Secondary | ICD-10-CM | POA: Diagnosis not present

## 2022-03-14 DIAGNOSIS — K219 Gastro-esophageal reflux disease without esophagitis: Secondary | ICD-10-CM | POA: Diagnosis not present

## 2022-03-14 DIAGNOSIS — R051 Acute cough: Secondary | ICD-10-CM | POA: Diagnosis not present

## 2022-03-14 DIAGNOSIS — K760 Fatty (change of) liver, not elsewhere classified: Secondary | ICD-10-CM | POA: Diagnosis not present

## 2022-03-14 DIAGNOSIS — Z79899 Other long term (current) drug therapy: Secondary | ICD-10-CM | POA: Diagnosis not present

## 2022-03-14 DIAGNOSIS — C787 Secondary malignant neoplasm of liver and intrahepatic bile duct: Secondary | ICD-10-CM | POA: Diagnosis not present

## 2022-03-14 DIAGNOSIS — C250 Malignant neoplasm of head of pancreas: Secondary | ICD-10-CM | POA: Diagnosis not present

## 2022-03-19 DIAGNOSIS — Z79631 Long term (current) use of antimetabolite agent: Secondary | ICD-10-CM | POA: Diagnosis not present

## 2022-03-19 DIAGNOSIS — R7881 Bacteremia: Secondary | ICD-10-CM | POA: Diagnosis not present

## 2022-03-19 DIAGNOSIS — B961 Klebsiella pneumoniae [K. pneumoniae] as the cause of diseases classified elsewhere: Secondary | ICD-10-CM | POA: Diagnosis not present

## 2022-03-19 DIAGNOSIS — R634 Abnormal weight loss: Secondary | ICD-10-CM | POA: Diagnosis not present

## 2022-03-19 DIAGNOSIS — G620 Drug-induced polyneuropathy: Secondary | ICD-10-CM | POA: Diagnosis not present

## 2022-03-19 DIAGNOSIS — K8681 Exocrine pancreatic insufficiency: Secondary | ICD-10-CM | POA: Diagnosis not present

## 2022-03-19 DIAGNOSIS — Z79633 Long term (current) use of mitotic inhibitor: Secondary | ICD-10-CM | POA: Diagnosis not present

## 2022-03-19 DIAGNOSIS — R630 Anorexia: Secondary | ICD-10-CM | POA: Diagnosis not present

## 2022-03-19 DIAGNOSIS — N401 Enlarged prostate with lower urinary tract symptoms: Secondary | ICD-10-CM | POA: Diagnosis not present

## 2022-03-19 DIAGNOSIS — K5900 Constipation, unspecified: Secondary | ICD-10-CM | POA: Diagnosis not present

## 2022-03-19 DIAGNOSIS — T451X5D Adverse effect of antineoplastic and immunosuppressive drugs, subsequent encounter: Secondary | ICD-10-CM | POA: Diagnosis not present

## 2022-03-19 DIAGNOSIS — R3911 Hesitancy of micturition: Secondary | ICD-10-CM | POA: Diagnosis not present

## 2022-03-19 DIAGNOSIS — Z9221 Personal history of antineoplastic chemotherapy: Secondary | ICD-10-CM | POA: Diagnosis not present

## 2022-03-19 DIAGNOSIS — C787 Secondary malignant neoplasm of liver and intrahepatic bile duct: Secondary | ICD-10-CM | POA: Diagnosis not present

## 2022-03-19 DIAGNOSIS — Z6824 Body mass index (BMI) 24.0-24.9, adult: Secondary | ICD-10-CM | POA: Diagnosis not present

## 2022-03-19 DIAGNOSIS — C250 Malignant neoplasm of head of pancreas: Secondary | ICD-10-CM | POA: Diagnosis not present

## 2022-03-19 DIAGNOSIS — E871 Hypo-osmolality and hyponatremia: Secondary | ICD-10-CM | POA: Diagnosis not present

## 2022-03-19 DIAGNOSIS — T451X5A Adverse effect of antineoplastic and immunosuppressive drugs, initial encounter: Secondary | ICD-10-CM | POA: Diagnosis not present

## 2022-03-19 DIAGNOSIS — R350 Frequency of micturition: Secondary | ICD-10-CM | POA: Diagnosis not present

## 2022-03-19 DIAGNOSIS — C259 Malignant neoplasm of pancreas, unspecified: Secondary | ICD-10-CM | POA: Diagnosis not present

## 2022-03-19 DIAGNOSIS — A0472 Enterocolitis due to Clostridium difficile, not specified as recurrent: Secondary | ICD-10-CM | POA: Diagnosis not present

## 2022-03-19 DIAGNOSIS — Z90411 Acquired partial absence of pancreas: Secondary | ICD-10-CM | POA: Diagnosis not present

## 2022-04-02 DIAGNOSIS — Z9079 Acquired absence of other genital organ(s): Secondary | ICD-10-CM | POA: Diagnosis not present

## 2022-04-02 DIAGNOSIS — C787 Secondary malignant neoplasm of liver and intrahepatic bile duct: Secondary | ICD-10-CM | POA: Diagnosis not present

## 2022-04-02 DIAGNOSIS — R3911 Hesitancy of micturition: Secondary | ICD-10-CM | POA: Diagnosis not present

## 2022-04-02 DIAGNOSIS — C250 Malignant neoplasm of head of pancreas: Secondary | ICD-10-CM | POA: Diagnosis not present

## 2022-04-02 DIAGNOSIS — Z79891 Long term (current) use of opiate analgesic: Secondary | ICD-10-CM | POA: Diagnosis not present

## 2022-04-02 DIAGNOSIS — Z90411 Acquired partial absence of pancreas: Secondary | ICD-10-CM | POA: Diagnosis not present

## 2022-04-02 DIAGNOSIS — C259 Malignant neoplasm of pancreas, unspecified: Secondary | ICD-10-CM | POA: Diagnosis not present

## 2022-04-02 DIAGNOSIS — Z79899 Other long term (current) drug therapy: Secondary | ICD-10-CM | POA: Diagnosis not present

## 2022-04-02 DIAGNOSIS — Z79630 Long term (current) use of alkylating agent: Secondary | ICD-10-CM | POA: Diagnosis not present

## 2022-04-02 DIAGNOSIS — R5383 Other fatigue: Secondary | ICD-10-CM | POA: Diagnosis not present

## 2022-04-02 DIAGNOSIS — R350 Frequency of micturition: Secondary | ICD-10-CM | POA: Diagnosis not present

## 2022-04-02 DIAGNOSIS — E871 Hypo-osmolality and hyponatremia: Secondary | ICD-10-CM | POA: Diagnosis not present

## 2022-04-07 ENCOUNTER — Other Ambulatory Visit: Payer: Self-pay

## 2022-04-07 MED ORDER — LORAZEPAM 1 MG PO TABS: 1.0000 mg | ORAL_TABLET | Freq: Three times a day (TID) | ORAL | 1 refills | Status: DC | PRN

## 2022-04-09 DIAGNOSIS — C787 Secondary malignant neoplasm of liver and intrahepatic bile duct: Secondary | ICD-10-CM | POA: Diagnosis not present

## 2022-04-09 DIAGNOSIS — C250 Malignant neoplasm of head of pancreas: Secondary | ICD-10-CM | POA: Diagnosis not present

## 2022-04-09 DIAGNOSIS — Z5111 Encounter for antineoplastic chemotherapy: Secondary | ICD-10-CM | POA: Diagnosis not present

## 2022-04-15 ENCOUNTER — Ambulatory Visit (INDEPENDENT_AMBULATORY_CARE_PROVIDER_SITE_OTHER): Payer: PPO | Admitting: Nurse Practitioner

## 2022-04-15 ENCOUNTER — Encounter: Payer: Self-pay | Admitting: Nurse Practitioner

## 2022-04-15 VITALS — BP 110/80 | HR 98 | Temp 96.6°F | Ht 71.0 in | Wt 171.0 lb

## 2022-04-15 DIAGNOSIS — J329 Chronic sinusitis, unspecified: Secondary | ICD-10-CM

## 2022-04-15 MED ORDER — AMOXICILLIN-POT CLAVULANATE 875-125 MG PO TABS: 1.0000 | ORAL_TABLET | Freq: Two times a day (BID) | ORAL | 0 refills | Status: DC

## 2022-04-15 NOTE — Patient Instructions (Signed)
Take antibiotics as prescribed Augmentin twice daily for 10 days Use Flonase nasal spray daily Follow-up as needed   Sinus Infection, Adult A sinus infection is soreness and swelling (inflammation) of your sinuses. Sinuses are hollow spaces in the bones around your face. They are located: Around your eyes. In the middle of your forehead. Behind your nose. In your cheekbones. Your sinuses and nasal passages are lined with a fluid called mucus. Mucus drains out of your sinuses. Swelling can trap mucus in your sinuses. This lets germs (bacteria, virus, or fungus) grow, which leads to infection. Most of the time, this condition is caused by a virus. What are the causes? Allergies. Asthma. Germs. Things that block your nose or sinuses. Growths in the nose (nasal polyps). Chemicals or irritants in the air. A fungus. This is rare. What increases the risk? Having a weak body defense system (immune system). Doing a lot of swimming or diving. Using nasal sprays too much. Smoking. What are the signs or symptoms? The main symptoms of this condition are pain and a feeling of pressure around the sinuses. Other symptoms include: Stuffy nose (congestion). This may make it hard to breathe through your nose. Runny nose (drainage). Soreness, swelling, and warmth in the sinuses. A cough that may get worse at night. Being unable to smell and taste. Mucus that collects in the throat or the back of the nose (postnasal drip). This may cause a sore throat or bad breath. Being very tired (fatigued). A fever. How is this diagnosed? Your symptoms. Your medical history. A physical exam. Tests to find out if your condition is short-term (acute) or long-term (chronic). Your doctor may: Check your nose for growths (polyps). Check your sinuses using a tool that has a light on one end (endoscope). Check for allergies or germs. Do imaging tests, such as an MRI or CT scan. How is this treated? Treatment for  this condition depends on the cause and whether it is short-term or long-term. If caused by a virus, your symptoms should go away on their own within 10 days. You may be given medicines to relieve symptoms. They include: Medicines that shrink swollen tissue in the nose. A spray that treats swelling of the nostrils. Rinses that help get rid of thick mucus in your nose (nasal saline washes). Medicines that treat allergies (antihistamines). Over-the-counter pain relievers. If caused by bacteria, your doctor may wait to see if you will get better without treatment. You may be given antibiotic medicine if you have: A very bad infection. A weak body defense system. If caused by growths in the nose, surgery may be needed. Follow these instructions at home: Medicines Take, use, or apply over-the-counter and prescription medicines only as told by your doctor. These may include nasal sprays. If you were prescribed an antibiotic medicine, take it as told by your doctor. Do not stop taking it even if you start to feel better. Hydrate and humidify  Drink enough water to keep your pee (urine) pale yellow. Use a cool mist humidifier to keep the humidity level in your home above 50%. Breathe in steam for 10-15 minutes, 3-4 times a day, or as told by your doctor. You can do this in the bathroom while a hot shower is running. Try not to spend time in cool or dry air. Rest Rest as much as you can. Sleep with your head raised (elevated). Make sure you get enough sleep each night. General instructions  Put a warm, moist washcloth on your  face 3-4 times a day, or as often as told by your doctor. Use nasal saline washes as often as told by your doctor. Wash your hands often with soap and water. If you cannot use soap and water, use hand sanitizer. Do not smoke. Avoid being around people who are smoking (secondhand smoke). Keep all follow-up visits. Contact a doctor if: You have a fever. Your symptoms get  worse. Your symptoms do not get better within 10 days. Get help right away if: You have a very bad headache. You cannot stop vomiting. You have very bad pain or swelling around your face or eyes. You have trouble seeing. You feel confused. Your neck is stiff. You have trouble breathing. These symptoms may be an emergency. Get help right away. Call 911. Do not wait to see if the symptoms will go away. Do not drive yourself to the hospital. Summary A sinus infection is swelling of your sinuses. Sinuses are hollow spaces in the bones around your face. This condition is caused by tissues in your nose that become inflamed or swollen. This traps germs. These can lead to infection. If you were prescribed an antibiotic medicine, take it as told by your doctor. Do not stop taking it even if you start to feel better. Keep all follow-up visits. This information is not intended to replace advice given to you by your health care provider. Make sure you discuss any questions you have with your health care provider. Document Revised: 03/26/2021 Document Reviewed: 03/26/2021 Elsevier Patient Education  [place].

## 2022-04-15 NOTE — Progress Notes (Signed)
Acute Office Visit  Subjective:    [name], male    DOB: 04-17-1966, 56 y.o.   MRN: [id number]  CC: URI  HPI: Patient is in today for Upper respiratory symptoms He complains of left side sinus congestion, left ear fullness, productive cough with  green colored sputum. Denies fever, chills, night sweats or body aches. Onset of symptoms was a few days ago and staying constant. Treatment has included  Hydroxyzine, Guaifenesin, Quercetin with Vit C, and drinking plenty of fluids. Pt is receiving chemotherapy for metastatic pancreatic cancer.   Past Medical History:  Diagnosis Date   Barrett's esophagus    Cervicalgia    Essential hypertension    GERD (gastroesophageal reflux disease)    History of COVID-19    05/2019   Mixed hyperlipidemia    Pancreatic carcinoma metastatic to liver [facility])    Primary malignant neoplasm of head of pancreas [facility])     Past Surgical History:  Procedure Laterality Date   LAPAROSCOPIC CHOLECYSTECTOMY  01/05/2015   NASAL POLYP SURGERY     REFRACTIVE SURGERY Bilateral 1988   WHIPPLE PROCEDURE  11/09/2015   Pylorus sparring    Family History  Problem Relation Age of Onset   Cancer Mother        breast   Hypertension Father    Hyperlipidemia Father    Cancer Father        prostate   Hypertension Sister     Social History   Socioeconomic History   Marital status: Married    Spouse name: Not on file   Number of children: 2   Years of education: Not on file   Highest education level: Not on file  Occupational History   Occupation: Retired    Comment: [employer]  Tobacco Use   Smoking status: Former    Types: Cigars    Quit date: 2005    Years since quitting: 18.9   Smokeless tobacco: Never  Substance and Sexual Activity   Alcohol use: Not Currently   Drug use: Never   Sexual activity: Yes    Partners: Female  Other Topics Concern   Not on file  Social History Narrative   Not on file   Social Determinants  of Health   Financial Resource Strain: Not on file  Food Insecurity: Not on file  Transportation Needs: Not on file  Physical Activity: Not on file  Stress: Not on file  Social Connections: Not on file  Intimate Partner Violence: Not on file    Outpatient Medications Prior to Visit  Medication Sig Dispense Refill   acetaminophen (TYLENOL) 500 MG tablet Take 500 mg by mouth every 6 (six) hours as needed.     amLODipine (NORVASC) 10 MG tablet Take 1 tablet (10 mg total) by mouth daily. 90 tablet 1   Bioflavonoid Products (QUERCETIN COMPLEX IMMUNE) CAPS Take 1 tablet by mouth daily. 1 capsule 0   CREON 36000-114000 units CPEP capsule TAKE 3 capsules with meals. 1 after starting/2 when finished eating. Take 2 capsules with snack after starting to eat.     dicyclomine (BENTYL) 10 MG capsule Take 10 mg by mouth 3 (three) times daily as needed.     ertapenem [facility]) IVPB Inject into the vein.     esomeprazole (NEXIUM) 40 MG capsule Take 1 capsule (40 mg total) by mouth daily. 90 capsule 3   gabapentin (NEURONTIN) 300 MG capsule Take 1 capsule by mouth at bedtime.     guaifenesin (HUMIBID  E) 400 MG TABS tablet Take by mouth as needed.     HYDROcodone-acetaminophen (NORCO/VICODIN) 5-325 MG tablet Take 1 tablet by mouth every 6 (six) hours as needed for moderate pain.     hydrOXYzine (VISTARIL) 25 MG capsule Take 1 capsule (25 mg total) by mouth 2 (two) times daily as needed for nausea or vomiting. 1 capsule 0   ibuprofen (ADVIL) 600 MG tablet Take 1 tablet (600 mg total) by mouth every 8 (eight) hours as needed. 30 tablet 1   linaclotide (LINZESS) 72 MCG capsule as needed.     lisinopril (ZESTRIL) 20 MG tablet Take 1 tablet (20 mg total) by mouth daily. 90 tablet 1   LORazepam (ATIVAN) 1 MG tablet Take 1 tablet (1 mg total) by mouth 3 (three) times daily as needed for anxiety or sedation. 90 tablet 1   meloxicam (MOBIC) 7.5 MG tablet TAKE ONE TABLET BY MOUTH TWICE DAILY 60 tablet 11    methocarbamol (ROBAXIN) 500 MG tablet Take 500 mg by mouth 2 (two) times daily.     Multiple Vitamin (MULTIVITAMIN) capsule Take 1 capsule by mouth daily.     Omega-3 1000 MG CAPS Take by mouth.     SODIUM CHLORIDE PO Take 1,000 mg by mouth daily.     tamsulosin (FLOMAX) 0.4 MG CAPS capsule Take 1 capsule (0.4 mg total) by mouth daily. 90 capsule 1   Thiamine HCl (VITAMIN B-1 PO) Take by mouth.     No facility-administered medications prior to visit.    Allergies  Allergen Reactions   Trazodone And Nefazodone     Somnolence. Cloudy feeling.    Mirtazapine     Insomnia.   Other Other (See Comments)    Prefers to not have coban wrap used due to the smell causes him to be sick   Oxycodone Itching   Vancomycin Rash    Review of Systems  Allergic/Immunologic: Positive for immunocompromised state.      See pertinent positives and negatives per HPI.  Objective:    Physical Exam Vitals reviewed.  Constitutional:      Appearance: He is ill-appearing.  HENT:     Right Ear: Tympanic membrane normal.     Left Ear: Tympanic membrane normal.     Nose: Congestion and rhinorrhea present.     Mouth/Throat:     Pharynx: Posterior oropharyngeal erythema present.  Cardiovascular:     Rate and Rhythm: Normal rate and regular rhythm.     Pulses: Normal pulses.     Heart sounds: Normal heart sounds.  Pulmonary:     Effort: Pulmonary effort is normal.     Breath sounds: Normal breath sounds.  Neurological:     Mental Status: He is alert.     BP 110/80   Pulse 98   Temp (!) 96.6 F (35.9 C)   Ht _0  (1.803 m)   Wt 171 lb (77.6 kg)   SpO2 97%   BMI 23.85 kg/m   Wt Readings from Last 3 Encounters:  01/28/22 174 lb (78.9 kg)  12/27/21 180 lb (81.6 kg)  12/11/21 184 lb (83.5 kg)    Health Maintenance Due  Topic Date Due   Medicare Annual Wellness (AWV)  Never done   COVID-19 Vaccine (1) Never done   HIV Screening  Never done   Hepatitis C Screening  Never done    DTaP/Tdap/Td (1 - Tdap) Never done   Zoster Vaccines- Shingrix (1 of 2) Never done   COLONOSCOPY (Pts 45-59yr  Insurance coverage will need to be confirmed)  Never done   INFLUENZA VACCINE  Never done   Lab Results  Component Value Date   WBC 3.6 08/26/2021   HGB 14.6 08/26/2021   HCT 42.5 08/26/2021   MCV 90 08/26/2021   PLT 196 08/26/2021   Lab Results  Component Value Date   NA 137 08/26/2021   K 4.6 08/26/2021   CO2 24 08/26/2021   GLUCOSE 96 08/26/2021   BUN 11 08/26/2021   CREATININE 0.92 08/26/2021   BILITOT 0.4 08/26/2021   ALKPHOS 78 08/26/2021   AST 24 08/26/2021   ALT 23 08/26/2021   PROT 6.6 08/26/2021   ALBUMIN 4.4 08/26/2021   CALCIUM 9.6 08/26/2021   EGFR 96 08/26/2021   Lab Results  Component Value Date   CHOL 171 08/26/2021   Lab Results  Component Value Date   HDL 56 08/26/2021   Lab Results  Component Value Date   LDLCALC 97 08/26/2021   Lab Results  Component Value Date   TRIG 102 08/26/2021   Lab Results  Component Value Date   CHOLHDL 3.1 08/26/2021        Assessment & Plan:    1. Recurrent sinusitis - amoxicillin-clavulanate (AUGMENTIN) 875-125 MG tablet; Take 1 tablet by mouth 2 (two) times daily.  Dispense: 20 tablet; Refill: 0   Take antibiotics as prescribed Augmentin twice daily for 10 days Use Flonase nasal spray daily Follow-up as needed     Follow-up: PRN  An After Visit Summary was printed and given to the patient.  I, Rip Harbour, NP, have reviewed all documentation for this visit. The documentation on 04/15/22 for the exam, diagnosis, procedures, and orders are all accurate and complete.    Signed, Rip Harbour, NP [place] [phone]

## 2022-04-25 DIAGNOSIS — N400 Enlarged prostate without lower urinary tract symptoms: Secondary | ICD-10-CM | POA: Diagnosis not present

## 2022-04-25 DIAGNOSIS — C787 Secondary malignant neoplasm of liver and intrahepatic bile duct: Secondary | ICD-10-CM | POA: Diagnosis not present

## 2022-04-25 DIAGNOSIS — C259 Malignant neoplasm of pancreas, unspecified: Secondary | ICD-10-CM | POA: Diagnosis not present

## 2022-05-11 NOTE — Progress Notes (Signed)
Subjective:  [name], male    DOB: October 19, 1965  Age: 57 y.o. MRN: [id number]  Chief Complaint  Patient presents with   Hypertension   Gastroesophageal Reflux    HPI Hypertension: Taking Amlodipine 10 mg daily, Lisinopril 20 mg daily. Hyperlipidemia: Takes Omega-3. GERD: Currently on Esomeprazole 40 mg daily. BPH: Using Tamsulosin 0.4 mg twice daily. Scheduled for a TURP later this month.  Patient is scheduled for TURP 05/27/2022. This is to improve the quality of his life.  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 Patient had liver ablation of most recent metastatic lesion 08/01/2021. Getting chemo treatments still, but difficult to tolerate.   Current Outpatient Medications on File Prior to Visit  Medication Sig Dispense Refill   acetaminophen (TYLENOL) 500 MG tablet Take 500 mg by mouth every 6 (six) hours as needed.     Bioflavonoid Products (QUERCETIN COMPLEX IMMUNE) CAPS Take 1 tablet by mouth daily. 1 capsule 0   CREON 36000-114000 units CPEP capsule TAKE 3 capsules with meals. 1 after starting/2 when finished eating. Take 2 capsules with snack after starting to eat.     cyclobenzaprine (FLEXERIL) 10 MG tablet Take 10 mg by mouth 3 (three) times daily as needed for muscle spasms.     dicyclomine (BENTYL) 10 MG capsule Take 10 mg by mouth 3 (three) times daily as needed.     guaifenesin (HUMIBID E) 400 MG TABS tablet Take by mouth as needed.     HYDROcodone-acetaminophen (NORCO/VICODIN) 5-325 MG tablet Take 1 tablet by mouth every 6 (six) hours as needed for moderate pain.     hydrOXYzine (VISTARIL) 25 MG capsule Take 1 capsule (25 mg total) by mouth 2 (two) times daily as needed for nausea or vomiting. 1 capsule 0   LORazepam (ATIVAN) 1 MG tablet Take 1 tablet (1 mg total) by mouth 3 (three) times daily as needed for anxiety or sedation. 90 tablet 1   Multiple Vitamin (MULTIVITAMIN) capsule Take 1 capsule by mouth daily.     Omega-3 1000 MG CAPS Take by  mouth.     SODIUM CHLORIDE PO Take 1,000 mg by mouth daily.     tamsulosin (FLOMAX) 0.4 MG CAPS capsule Take 1 capsule (0.4 mg total) by mouth daily. (Patient taking differently: Take 0.4 mg by mouth in the morning and at bedtime.) 90 capsule 1   Thiamine HCl (VITAMIN B-1 PO) Take by mouth.     ertapenem (INVANZ) IVPB Inject into the vein.     No current facility-administered medications on file prior to visit.   Past Medical History:  Diagnosis Date   Barrett's esophagus    Cervicalgia    Essential hypertension    GERD (gastroesophageal reflux disease)    History of COVID-19    05/2019   Mixed hyperlipidemia    Pancreatic carcinoma metastatic to liver [facility])    Primary malignant neoplasm of head of pancreas [facility])    Past Surgical History:  Procedure Laterality Date   LAPAROSCOPIC CHOLECYSTECTOMY  01/05/2015   NASAL POLYP SURGERY     REFRACTIVE SURGERY Bilateral 1988   WHIPPLE PROCEDURE  11/09/2015   Pylorus sparring    Family History  Problem Relation Age of Onset   Cancer Mother        breast   Hypertension Father    Hyperlipidemia Father    Cancer Father        prostate   Hypertension Sister    Social History   Socioeconomic History  Marital status: Married    Spouse name: Not on file   Number of children: 2   Years of education: Not on file   Highest education level: Not on file  Occupational History   Occupation: Retired    Comment: [employer]  Tobacco Use   Smoking status: Former    Types: Cigars    Quit date: 2005    Years since quitting: 19.0   Smokeless tobacco: Never  Substance and Sexual Activity   Alcohol use: Not Currently   Drug use: Never   Sexual activity: Yes    Partners: Female  Other Topics Concern   Not on file  Social History Narrative   Not on file   Social Determinants of Health   Financial Resource Strain: Not on file  Food Insecurity: Not on file  Transportation Needs: Not on file  Physical Activity: Not on file  Stress:  Not on file  Social Connections: Not on file    Review of Systems  Constitutional:  Positive for fever (100.3 last week for 12 hours). Negative for chills, fatigue and unexpected weight change.  HENT:  Negative for congestion, ear pain, sinus pain and sore throat.   Respiratory:  Negative for cough and shortness of breath.   Cardiovascular:  Negative for chest pain and palpitations.  Gastrointestinal:  Positive for constipation, nausea and vomiting. Negative for abdominal pain, blood in stool and diarrhea.  Endocrine: Negative for polydipsia.  Genitourinary:  Positive for difficulty urinating and dysuria.       TURP scheduled for Jan. 23, 2024.    Musculoskeletal:  Positive for arthralgias (diffuse joint pain) and back pain.  Skin:  Negative for rash.  Neurological:  Positive for weakness and light-headedness. Negative for headaches.  Psychiatric/Behavioral:  Negative for dysphoric mood. The patient is not nervous/anxious.      Objective:  BP 106/60   Pulse 76   Resp 18   Ht 5\' 11"  (1.803 m)   Wt 170 lb (77.1 kg)   BMI 23.71 kg/m      05/12/2022    8:44 AM 04/15/2022    9:48 AM 01/28/2022    8:43 AM  BP/Weight  Systolic BP 106 110 110  Diastolic BP 60 80 68  Wt. (Lbs) 170 171 174  BMI 23.71 kg/m2 23.85 kg/m2 24.27 kg/m2    Physical Exam Constitutional:      Appearance: Normal appearance.  Cardiovascular:     Rate and Rhythm: Normal rate and regular rhythm.     Pulses: Normal pulses.     Heart sounds: Normal heart sounds.  Pulmonary:     Effort: Pulmonary effort is normal.     Breath sounds: Normal breath sounds.  Abdominal:     General: Bowel sounds are normal.     Palpations: Abdomen is soft. There is no mass.     Tenderness: There is no abdominal tenderness.  Neurological:     Mental Status: He is alert.  Psychiatric:        Mood and Affect: Mood normal.        Behavior: Behavior normal.        Thought Content: Thought content normal.     Diabetic Foot  Exam - Simple   No data filed      Lab Results  Component Value Date   WBC 3.6 08/26/2021   HGB 14.6 08/26/2021   HCT 42.5 08/26/2021   PLT 196 08/26/2021   GLUCOSE 96 08/26/2021   CHOL 171 08/26/2021  TRIG 102 08/26/2021   HDL 56 08/26/2021   LDLCALC 97 08/26/2021   ALT 23 08/26/2021   AST 24 08/26/2021   NA 137 08/26/2021   K 4.6 08/26/2021   CL 99 08/26/2021   CREATININE 0.92 08/26/2021   BUN 11 08/26/2021   CO2 24 08/26/2021      Assessment & Plan:   Problem List Items Addressed This Visit       Cardiovascular and Mediastinum   Essential hypertension, benign - Primary    Well controlled.  No changes to medicines. Taking Amlodipine 10 mg daily, Lisinopril 20 mg daily. Continue to work on eating a healthy diet and exercise.  Labs drawn today.        Relevant Medications   amLODipine (NORVASC) 10 MG tablet   lisinopril (ZESTRIL) 20 MG tablet     Digestive   Pancreatic cancer metastasized to liver [facility])    Management per specialist. Getting chemo treatments still.  Patient is scheduled for TURP 05/27/2022.      GERD without esophagitis    Continue with Esomeprazole 40 mg daily.      Relevant Medications   esomeprazole (NEXIUM) 40 MG capsule   linaclotide (LINZESS) 72 MCG capsule     Genitourinary   BPH with obstruction/lower urinary tract symptoms    Turp scheduled for next week.  Medications: not helping.        Other   Severe protein-calorie malnutrition Lily Kocher: less than 60% of standard weight) (HCC)    Continue protein drinks.  Encouraged to eat 3 meals per day.       Immunocompromised state due to drug therapy [facility])    Due to chemo and cancer.      .  Meds ordered this encounter  Medications   amLODipine (NORVASC) 10 MG tablet    Sig: Take 1 tablet (10 mg total) by mouth daily.    Dispense:  90 tablet    Refill:  1    This prescription was filled on 08/12/2021. Any refills authorized will be placed on file.   esomeprazole  (NEXIUM) 40 MG capsule    Sig: Take 1 capsule (40 mg total) by mouth daily.    Dispense:  90 capsule    Refill:  3    This prescription was filled on 02/07/2021. Any refills authorized will be placed on file.   gabapentin (NEURONTIN) 300 MG capsule    Sig: Take 1 capsule (300 mg total) by mouth 2 (two) times daily.    Dispense:  180 capsule    Refill:  1   linaclotide (LINZESS) 72 MCG capsule    Sig: Take 1 capsule (72 mcg total) by mouth daily before breakfast.    Dispense:  90 capsule    Refill:  3   lisinopril (ZESTRIL) 20 MG tablet    Sig: Take 1 tablet (20 mg total) by mouth daily.    Dispense:  90 tablet    Refill:  3   meloxicam (MOBIC) 7.5 MG tablet    Sig: Take 1 tablet (7.5 mg total) by mouth 2 (two) times daily.    Dispense:  180 tablet    Refill:  3   Total time spent on today's visit was greater than 30 minutes, including both face-to-face time and nonface-to-face time personally spent on review of chart (labs and imaging), discussing labs and goals, discussing further work-up, treatment options, referrals to specialist if needed, reviewing outside records of pertinent, answering patient's questions, and coordinating care.,  Follow-up: Return in about 3 months (around 08/11/2022) for chronic follow up.  An After Visit Summary was printed and given to the patient. Tawny Asal, CMA,acting as a scribe for Blane Ohara, MD.,have documented all relevant documentation on the behalf of Blane Ohara, MD,as directed by  Blane Ohara, MD while in the presence of Blane Ohara, MD.   Blane Ohara, MD [facility] [phone]

## 2022-05-12 ENCOUNTER — Ambulatory Visit (INDEPENDENT_AMBULATORY_CARE_PROVIDER_SITE_OTHER): Payer: PPO | Admitting: Family Medicine

## 2022-05-12 VITALS — BP 106/60 | HR 76 | Resp 18 | Ht 71.0 in | Wt 170.0 lb

## 2022-05-12 DIAGNOSIS — Z79899 Other long term (current) drug therapy: Secondary | ICD-10-CM

## 2022-05-12 DIAGNOSIS — K219 Gastro-esophageal reflux disease without esophagitis: Secondary | ICD-10-CM

## 2022-05-12 DIAGNOSIS — D84821 Immunodeficiency due to drugs: Secondary | ICD-10-CM

## 2022-05-12 DIAGNOSIS — N138 Other obstructive and reflux uropathy: Secondary | ICD-10-CM | POA: Diagnosis not present

## 2022-05-12 DIAGNOSIS — C787 Secondary malignant neoplasm of liver and intrahepatic bile duct: Secondary | ICD-10-CM | POA: Diagnosis not present

## 2022-05-12 DIAGNOSIS — N401 Enlarged prostate with lower urinary tract symptoms: Secondary | ICD-10-CM

## 2022-05-12 DIAGNOSIS — C259 Malignant neoplasm of pancreas, unspecified: Secondary | ICD-10-CM

## 2022-05-12 DIAGNOSIS — I10 Essential (primary) hypertension: Secondary | ICD-10-CM | POA: Diagnosis not present

## 2022-05-12 DIAGNOSIS — G4733 Obstructive sleep apnea (adult) (pediatric): Secondary | ICD-10-CM

## 2022-05-12 DIAGNOSIS — E43 Unspecified severe protein-calorie malnutrition: Secondary | ICD-10-CM

## 2022-05-12 DIAGNOSIS — F5101 Primary insomnia: Secondary | ICD-10-CM

## 2022-05-12 MED ORDER — GABAPENTIN 300 MG PO CAPS: 300.0000 mg | ORAL_CAPSULE | Freq: Two times a day (BID) | ORAL | 1 refills | Status: DC

## 2022-05-12 MED ORDER — LINACLOTIDE 72 MCG PO CAPS: 72.0000 ug | ORAL_CAPSULE | Freq: Every day | ORAL | 3 refills | Status: DC

## 2022-05-12 MED ORDER — LISINOPRIL 20 MG PO TABS: 20.0000 mg | ORAL_TABLET | Freq: Every day | ORAL | 3 refills | Status: DC

## 2022-05-12 MED ORDER — ESOMEPRAZOLE MAGNESIUM 40 MG PO CPDR: 40.0000 mg | DELAYED_RELEASE_CAPSULE | Freq: Every day | ORAL | 3 refills | Status: DC

## 2022-05-12 MED ORDER — MELOXICAM 7.5 MG PO TABS: 7.5000 mg | ORAL_TABLET | Freq: Two times a day (BID) | ORAL | 3 refills | Status: DC

## 2022-05-12 MED ORDER — AMLODIPINE BESYLATE 10 MG PO TABS: 10.0000 mg | ORAL_TABLET | Freq: Every day | ORAL | 1 refills | Status: DC

## 2022-05-12 NOTE — Patient Instructions (Signed)
Recommend: Flu shot.  Shingles vaccines Tetanus (tdap)

## 2022-05-14 DIAGNOSIS — Z79899 Other long term (current) drug therapy: Secondary | ICD-10-CM | POA: Diagnosis not present

## 2022-05-14 DIAGNOSIS — G620 Drug-induced polyneuropathy: Secondary | ICD-10-CM | POA: Diagnosis not present

## 2022-05-14 DIAGNOSIS — Z79633 Long term (current) use of mitotic inhibitor: Secondary | ICD-10-CM | POA: Diagnosis not present

## 2022-05-14 DIAGNOSIS — C787 Secondary malignant neoplasm of liver and intrahepatic bile duct: Secondary | ICD-10-CM | POA: Diagnosis not present

## 2022-05-14 DIAGNOSIS — N139 Obstructive and reflux uropathy, unspecified: Secondary | ICD-10-CM | POA: Diagnosis not present

## 2022-05-14 DIAGNOSIS — R3911 Hesitancy of micturition: Secondary | ICD-10-CM | POA: Diagnosis not present

## 2022-05-14 DIAGNOSIS — C259 Malignant neoplasm of pancreas, unspecified: Secondary | ICD-10-CM | POA: Diagnosis not present

## 2022-05-14 DIAGNOSIS — C250 Malignant neoplasm of head of pancreas: Secondary | ICD-10-CM | POA: Diagnosis not present

## 2022-05-14 DIAGNOSIS — R350 Frequency of micturition: Secondary | ICD-10-CM | POA: Diagnosis not present

## 2022-05-14 DIAGNOSIS — Z9049 Acquired absence of other specified parts of digestive tract: Secondary | ICD-10-CM | POA: Diagnosis not present

## 2022-05-17 NOTE — Assessment & Plan Note (Signed)
Management per specialist. Getting chemo treatments still.  Patient is scheduled for TURP 05/27/2022.

## 2022-05-17 NOTE — Assessment & Plan Note (Signed)
Well controlled.  No changes to medicines. Taking Amlodipine 10 mg daily, Lisinopril 20 mg daily. Continue to work on eating a healthy diet and exercise.  Labs drawn today.

## 2022-05-17 NOTE — Assessment & Plan Note (Signed)
Continue with Esomeprazole 40 mg daily.

## 2022-05-18 ENCOUNTER — Encounter: Payer: Self-pay | Admitting: Family Medicine

## 2022-05-18 DIAGNOSIS — Z79899 Other long term (current) drug therapy: Secondary | ICD-10-CM | POA: Insufficient documentation

## 2022-05-18 DIAGNOSIS — D84821 Immunodeficiency due to drugs: Secondary | ICD-10-CM

## 2022-05-18 DIAGNOSIS — N138 Other obstructive and reflux uropathy: Secondary | ICD-10-CM | POA: Insufficient documentation

## 2022-05-18 DIAGNOSIS — N401 Enlarged prostate with lower urinary tract symptoms: Secondary | ICD-10-CM | POA: Insufficient documentation

## 2022-05-18 HISTORY — DX: Immunodeficiency due to drugs: D84.821

## 2022-05-18 NOTE — Assessment & Plan Note (Signed)
Turp scheduled for next week.  Medications: not helping.

## 2022-05-18 NOTE — Assessment & Plan Note (Signed)
Due to chemo and cancer.

## 2022-05-18 NOTE — Assessment & Plan Note (Signed)
Continue protein drinks.  Encouraged to eat 3 meals per day.

## 2022-05-27 DIAGNOSIS — N3289 Other specified disorders of bladder: Secondary | ICD-10-CM | POA: Diagnosis not present

## 2022-05-27 DIAGNOSIS — N411 Chronic prostatitis: Secondary | ICD-10-CM | POA: Diagnosis not present

## 2022-05-27 DIAGNOSIS — N401 Enlarged prostate with lower urinary tract symptoms: Secondary | ICD-10-CM | POA: Diagnosis not present

## 2022-05-27 DIAGNOSIS — N410 Acute prostatitis: Secondary | ICD-10-CM | POA: Diagnosis not present

## 2022-05-27 DIAGNOSIS — R350 Frequency of micturition: Secondary | ICD-10-CM | POA: Diagnosis not present

## 2022-06-10 ENCOUNTER — Other Ambulatory Visit: Payer: Self-pay | Admitting: Family Medicine

## 2022-06-11 DIAGNOSIS — G620 Drug-induced polyneuropathy: Secondary | ICD-10-CM | POA: Diagnosis not present

## 2022-06-11 DIAGNOSIS — C787 Secondary malignant neoplasm of liver and intrahepatic bile duct: Secondary | ICD-10-CM | POA: Diagnosis not present

## 2022-06-11 DIAGNOSIS — R634 Abnormal weight loss: Secondary | ICD-10-CM | POA: Diagnosis not present

## 2022-06-11 DIAGNOSIS — Z79899 Other long term (current) drug therapy: Secondary | ICD-10-CM | POA: Diagnosis not present

## 2022-06-11 DIAGNOSIS — C259 Malignant neoplasm of pancreas, unspecified: Secondary | ICD-10-CM | POA: Diagnosis not present

## 2022-06-11 DIAGNOSIS — Z791 Long term (current) use of non-steroidal anti-inflammatories (NSAID): Secondary | ICD-10-CM | POA: Diagnosis not present

## 2022-06-11 DIAGNOSIS — E871 Hypo-osmolality and hyponatremia: Secondary | ICD-10-CM | POA: Diagnosis not present

## 2022-06-11 DIAGNOSIS — N320 Bladder-neck obstruction: Secondary | ICD-10-CM | POA: Diagnosis not present

## 2022-06-11 DIAGNOSIS — C250 Malignant neoplasm of head of pancreas: Secondary | ICD-10-CM | POA: Diagnosis not present

## 2022-06-14 ENCOUNTER — Other Ambulatory Visit: Payer: Self-pay | Admitting: Family Medicine

## 2022-06-20 DIAGNOSIS — N400 Enlarged prostate without lower urinary tract symptoms: Secondary | ICD-10-CM | POA: Diagnosis not present

## 2022-06-27 ENCOUNTER — Telehealth (INDEPENDENT_AMBULATORY_CARE_PROVIDER_SITE_OTHER): Payer: PPO | Admitting: Family Medicine

## 2022-06-27 ENCOUNTER — Encounter: Payer: Self-pay | Admitting: Family Medicine

## 2022-06-27 VITALS — Temp 99.8°F | Ht 71.0 in | Wt 170.0 lb

## 2022-06-27 DIAGNOSIS — J019 Acute sinusitis, unspecified: Secondary | ICD-10-CM | POA: Insufficient documentation

## 2022-06-27 DIAGNOSIS — J018 Other acute sinusitis: Secondary | ICD-10-CM

## 2022-06-27 MED ORDER — AMOXICILLIN-POT CLAVULANATE 875-125 MG PO TABS: 1.0000 | ORAL_TABLET | Freq: Two times a day (BID) | ORAL | 0 refills | Status: DC

## 2022-06-27 NOTE — Assessment & Plan Note (Addendum)
-   symptoms and exam c/w sinusitis   -- will treat with AUGMENTIN x 10 days - discussed symptomatic management (flonase), natural course, and return precautions

## 2022-06-27 NOTE — Patient Instructions (Addendum)
-   symptoms and exam c/w sinusitis   -- will treat with AUGMENTIN x 10 days - discussed symptomatic management (flonase, decongestants, etc), natural course, and return precautions

## 2022-06-27 NOTE — Progress Notes (Unsigned)
Virtual Visit via Video Note   This visit type was conducted either per patient request or due to national recommendations for restrictions regarding the COVID-19 Pandemic (e.g. social distancing) in an effort to limit this patient's exposure and mitigate transmission in our community.  Due to his co-morbid illnesses, this patient is at least at moderate risk for complications without adequate follow up.  This format is felt to be most appropriate for this patient at this time.  All issues noted in this document were discussed and addressed.  A limited physical exam was performed with this format.  A verbal consent was obtained for the virtual visit.   Date:  06/29/2022   ID:  Daniel Neal, DOB April 15, 1966, MRN [id number]  Patient Location: Home Provider Location: Office/Clinic  PCP:  Rochel Brome, MD   Chief Complaint:  Congestion,cough,sinus pressure  History of Present Illness:     Upper respiratory symptoms He complains of congestion, cough described as productive, facial pain, low grade fever, productive cough with  LIGHT GREEN colored sputum, and sinus pressure.with fever to 99.8 degrees Fahrenheit. Onset of symptoms was a few days ago and staying constant.He is drinking plenty of fluids.  Past history is significant for no history of pneumonia or bronchitis. Patient is non-smoker. Patient has advanced pancreatic cancer. Patient has been using guaifenacin.  Left ear popping.   Patient did a home Covid test this morning it was negative.  Chief Complaint  Patient presents with   Cough   Nasal Congestion   Facial Pain   Fever  .  The patient does have symptoms concerning for COVID-19 infection (fever, chills, cough, or new shortness of breath).    Past Medical History:  Diagnosis Date   Barrett's esophagus    Cervicalgia    Essential hypertension    GERD (gastroesophageal reflux disease)    History of COVID-19    05/2019   Mixed hyperlipidemia    Pancreatic carcinoma  metastatic to liver [facility])    Primary malignant neoplasm of head of pancreas [facility])     Past Surgical History:  Procedure Laterality Date   LAPAROSCOPIC CHOLECYSTECTOMY  01/05/2015   NASAL POLYP SURGERY     REFRACTIVE SURGERY Bilateral 1988   WHIPPLE PROCEDURE  11/09/2015   Pylorus sparring    Family History  Problem Relation Age of Onset   Cancer Mother        breast   Hypertension Father    Hyperlipidemia Father    Cancer Father        prostate   Hypertension Sister     Social History   Socioeconomic History   Marital status: Married    Spouse name: Not on file   Number of children: 2   Years of education: Not on file   Highest education level: Not on file  Occupational History   Occupation: Retired    Comment: [employer]  Tobacco Use   Smoking status: Former    Types: Cigars    Quit date: 2005    Years since quitting: 19.1   Smokeless tobacco: Never  Substance and Sexual Activity   Alcohol use: Not Currently   Drug use: Never   Sexual activity: Yes    Partners: Female  Other Topics Concern   Not on file  Social History Narrative   Not on file   Social Determinants of Health   Financial Resource Strain: Not on file  Food Insecurity: Not on file  Transportation Needs: Not on file  Physical  Activity: Not on file  Stress: Not on file  Social Connections: Not on file  Intimate Partner Violence: Not on file    Outpatient Medications Prior to Visit  Medication Sig Dispense Refill   acetaminophen (TYLENOL) 500 MG tablet Take 500 mg by mouth every 6 (six) hours as needed.     amLODipine (NORVASC) 10 MG tablet Take 1 tablet (10 mg total) by mouth daily. 90 tablet 1   Bioflavonoid Products (QUERCETIN COMPLEX IMMUNE) CAPS Take 1 tablet by mouth daily. 1 capsule 0   CREON 36000-114000 units CPEP capsule TAKE 3 capsules with meals. 1 after starting/2 when finished eating. Take 2 capsules with snack after starting to eat.     cyclobenzaprine (FLEXERIL) 10 MG  tablet Take 10 mg by mouth 3 (three) times daily as needed for muscle spasms.     dicyclomine (BENTYL) 10 MG capsule Take 10 mg by mouth 3 (three) times daily as needed.     esomeprazole (NEXIUM) 40 MG capsule Take 1 capsule (40 mg total) by mouth daily. 90 capsule 3   gabapentin (NEURONTIN) 300 MG capsule Take 1 capsule (300 mg total) by mouth 2 (two) times daily. 180 capsule 1   guaifenesin (HUMIBID E) 400 MG TABS tablet Take by mouth as needed.     HYDROcodone-acetaminophen (NORCO/VICODIN) 5-325 MG tablet Take 1 tablet by mouth every 6 (six) hours as needed for moderate pain.     hydrOXYzine (VISTARIL) 25 MG capsule Take 1 capsule (25 mg total) by mouth 2 (two) times daily as needed for nausea or vomiting. 1 capsule 0   linaclotide (LINZESS) 72 MCG capsule Take 1 capsule (72 mcg total) by mouth daily before breakfast. 90 capsule 3   lisinopril (ZESTRIL) 20 MG tablet Take 1 tablet (20 mg total) by mouth daily. 90 tablet 3   LORazepam (ATIVAN) 1 MG tablet Take 1 tablet by mouth 3 times daily as needed for anxiety or sedation. 90 tablet 1   meloxicam (MOBIC) 7.5 MG tablet Take 1 tablet (7.5 mg total) by mouth 2 (two) times daily. 180 tablet 3   Multiple Vitamin (MULTIVITAMIN) capsule Take 1 capsule by mouth daily.     Omega-3 1000 MG CAPS Take by mouth.     SODIUM CHLORIDE PO Take 1,000 mg by mouth daily.     Thiamine HCl (VITAMIN B-1 PO) Take by mouth.     ertapenem (INVANZ) IVPB Inject into the vein.     tamsulosin (FLOMAX) 0.4 MG CAPS capsule Take 1 capsule (0.4 mg total) by mouth daily. 90 capsule 1   No facility-administered medications prior to visit.    Allergies  Allergen Reactions   Trazodone And Nefazodone     Somnolence. Cloudy feeling.    Mirtazapine     Insomnia.   Other Other (See Comments)    Prefers to not have coban wrap used due to the smell causes him to be sick   Oxycodone Itching   Vancomycin Rash     Social History   Tobacco Use   Smoking status: Former     Types: Cigars    Quit date: 2005    Years since quitting: 19.1   Smokeless tobacco: Never  Substance Use Topics   Alcohol use: Not Currently   Drug use: Never     Review of Systems  Constitutional:  Positive for fever.  HENT:  Positive for congestion and sinus pain. Negative for sore throat.   Respiratory:  Positive for cough and sputum production.  Cardiovascular:  Negative for chest pain.     Labs/Other Tests and Data Reviewed:    Recent Labs: 08/26/2021: ALT 23; BUN 11; Creatinine, Ser 0.92; Hemoglobin 14.6; Platelets 196; Potassium 4.6; Sodium 137   Recent Lipid Panel Lab Results  Component Value Date/Time   CHOL 171 08/26/2021 08:13 AM   TRIG 102 08/26/2021 08:13 AM   HDL 56 08/26/2021 08:13 AM   CHOLHDL 3.1 08/26/2021 08:13 AM   LDLCALC 97 08/26/2021 08:13 AM    Wt Readings from Last 3 Encounters:  06/27/22 170 lb (77.1 kg)  05/12/22 170 lb (77.1 kg)  04/15/22 171 lb (77.6 kg)     Objective:    Vital Signs:  Temp 99.8 F (37.7 C) (Temporal)   Ht '5\' 11"'$  (1.803 m)   Wt 170 lb (77.1 kg)   BMI 23.71 kg/m    Physical Exam Vitals reviewed.  Constitutional:      Appearance: He is ill-appearing.  Neurological:     Mental Status: He is alert.      ASSESSMENT & PLAN:   Acute infection of nasal sinus - symptoms and exam c/w sinusitis   -- will treat with AUGMENTIN x 10 days - discussed symptomatic management (flonase), natural course, and return precautions     No orders of the defined types were placed in this encounter.    Meds ordered this encounter  Medications   amoxicillin-clavulanate (AUGMENTIN) 875-125 MG tablet    Sig: Take 1 tablet by mouth 2 (two) times daily.    Dispense:  20 tablet    Refill:  0    I spent 5 minutes dedicated to the care of this patient on the date of this encounter to include face-to-face time.  Follow Up:  In Person prn  Signed, Rochel Brome, MD  06/29/2022 11:41 PM    [place]

## 2022-07-02 DIAGNOSIS — G629 Polyneuropathy, unspecified: Secondary | ICD-10-CM | POA: Diagnosis not present

## 2022-07-02 DIAGNOSIS — T80211A Bloodstream infection due to central venous catheter, initial encounter: Secondary | ICD-10-CM | POA: Diagnosis not present

## 2022-07-02 DIAGNOSIS — M25511 Pain in right shoulder: Secondary | ICD-10-CM | POA: Diagnosis not present

## 2022-07-02 DIAGNOSIS — C250 Malignant neoplasm of head of pancreas: Secondary | ICD-10-CM | POA: Diagnosis not present

## 2022-07-02 DIAGNOSIS — E559 Vitamin D deficiency, unspecified: Secondary | ICD-10-CM | POA: Diagnosis not present

## 2022-07-02 DIAGNOSIS — B9620 Unspecified Escherichia coli [E. coli] as the cause of diseases classified elsewhere: Secondary | ICD-10-CM | POA: Diagnosis not present

## 2022-07-02 DIAGNOSIS — K219 Gastro-esophageal reflux disease without esophagitis: Secondary | ICD-10-CM | POA: Diagnosis not present

## 2022-07-02 DIAGNOSIS — R0982 Postnasal drip: Secondary | ICD-10-CM | POA: Diagnosis not present

## 2022-07-02 DIAGNOSIS — R109 Unspecified abdominal pain: Secondary | ICD-10-CM | POA: Diagnosis not present

## 2022-07-02 DIAGNOSIS — G893 Neoplasm related pain (acute) (chronic): Secondary | ICD-10-CM | POA: Diagnosis not present

## 2022-07-02 DIAGNOSIS — Z87891 Personal history of nicotine dependence: Secondary | ICD-10-CM | POA: Diagnosis not present

## 2022-07-02 DIAGNOSIS — Z881 Allergy status to other antibiotic agents status: Secondary | ICD-10-CM | POA: Diagnosis not present

## 2022-07-02 DIAGNOSIS — Z79899 Other long term (current) drug therapy: Secondary | ICD-10-CM | POA: Diagnosis not present

## 2022-07-02 DIAGNOSIS — Z8616 Personal history of COVID-19: Secondary | ICD-10-CM | POA: Diagnosis not present

## 2022-07-02 DIAGNOSIS — E871 Hypo-osmolality and hyponatremia: Secondary | ICD-10-CM | POA: Diagnosis not present

## 2022-07-02 DIAGNOSIS — Z95828 Presence of other vascular implants and grafts: Secondary | ICD-10-CM | POA: Diagnosis not present

## 2022-07-02 DIAGNOSIS — R300 Dysuria: Secondary | ICD-10-CM | POA: Diagnosis not present

## 2022-07-02 DIAGNOSIS — K589 Irritable bowel syndrome without diarrhea: Secondary | ICD-10-CM | POA: Diagnosis not present

## 2022-07-02 DIAGNOSIS — G8929 Other chronic pain: Secondary | ICD-10-CM | POA: Diagnosis not present

## 2022-07-02 DIAGNOSIS — B9629 Other Escherichia coli [E. coli] as the cause of diseases classified elsewhere: Secondary | ICD-10-CM | POA: Diagnosis not present

## 2022-07-02 DIAGNOSIS — C259 Malignant neoplasm of pancreas, unspecified: Secondary | ICD-10-CM | POA: Diagnosis not present

## 2022-07-02 DIAGNOSIS — D509 Iron deficiency anemia, unspecified: Secondary | ICD-10-CM | POA: Diagnosis not present

## 2022-07-02 DIAGNOSIS — Z885 Allergy status to narcotic agent status: Secondary | ICD-10-CM | POA: Diagnosis not present

## 2022-07-02 DIAGNOSIS — R110 Nausea: Secondary | ICD-10-CM | POA: Diagnosis not present

## 2022-07-02 DIAGNOSIS — R7881 Bacteremia: Secondary | ICD-10-CM | POA: Diagnosis not present

## 2022-07-02 DIAGNOSIS — C772 Secondary and unspecified malignant neoplasm of intra-abdominal lymph nodes: Secondary | ICD-10-CM | POA: Diagnosis not present

## 2022-07-02 DIAGNOSIS — G620 Drug-induced polyneuropathy: Secondary | ICD-10-CM | POA: Diagnosis not present

## 2022-07-02 DIAGNOSIS — H9202 Otalgia, left ear: Secondary | ICD-10-CM | POA: Diagnosis not present

## 2022-07-02 DIAGNOSIS — C7982 Secondary malignant neoplasm of genital organs: Secondary | ICD-10-CM | POA: Diagnosis not present

## 2022-07-02 DIAGNOSIS — C257 Malignant neoplasm of other parts of pancreas: Secondary | ICD-10-CM | POA: Diagnosis not present

## 2022-07-02 DIAGNOSIS — K5900 Constipation, unspecified: Secondary | ICD-10-CM | POA: Diagnosis not present

## 2022-07-02 DIAGNOSIS — H938X2 Other specified disorders of left ear: Secondary | ICD-10-CM | POA: Diagnosis not present

## 2022-07-02 DIAGNOSIS — Z20822 Contact with and (suspected) exposure to covid-19: Secondary | ICD-10-CM | POA: Diagnosis not present

## 2022-07-02 DIAGNOSIS — B342 Coronavirus infection, unspecified: Secondary | ICD-10-CM | POA: Diagnosis not present

## 2022-07-02 DIAGNOSIS — I10 Essential (primary) hypertension: Secondary | ICD-10-CM | POA: Diagnosis not present

## 2022-07-02 DIAGNOSIS — K8689 Other specified diseases of pancreas: Secondary | ICD-10-CM | POA: Diagnosis not present

## 2022-07-02 DIAGNOSIS — R918 Other nonspecific abnormal finding of lung field: Secondary | ICD-10-CM | POA: Diagnosis not present

## 2022-07-02 DIAGNOSIS — C61 Malignant neoplasm of prostate: Secondary | ICD-10-CM | POA: Diagnosis not present

## 2022-07-02 DIAGNOSIS — Z90411 Acquired partial absence of pancreas: Secondary | ICD-10-CM | POA: Diagnosis not present

## 2022-07-02 DIAGNOSIS — C787 Secondary malignant neoplasm of liver and intrahepatic bile duct: Secondary | ICD-10-CM | POA: Diagnosis not present

## 2022-07-09 DIAGNOSIS — R7881 Bacteremia: Secondary | ICD-10-CM | POA: Diagnosis not present

## 2022-07-10 ENCOUNTER — Telehealth: Payer: Self-pay | Admitting: *Deleted

## 2022-07-10 ENCOUNTER — Encounter: Payer: Self-pay | Admitting: *Deleted

## 2022-07-10 DIAGNOSIS — R7881 Bacteremia: Secondary | ICD-10-CM | POA: Diagnosis not present

## 2022-07-10 NOTE — Transitions of Care (Post Inpatient/ED Visit) (Signed)
   07/10/2022  Name: Daniel Neal MRN: [id number] DOB: Sep 25, 1965  Today's TOC FU Call Status: Today's TOC FU Call Status:: Successful TOC FU Call Competed (verified HIPAA identifiers x 2) TOC FU Call Complete Date: 07/10/22  Transition Care Management Follow-up Telephone Call Date of Discharge: 07/08/22 Discharge Facility: Other ([facility]) Name of Other (Non-[facility]) Discharge Facility: [facility] Type of Discharge: Inpatient Admission Primary Inpatient Discharge Diagnosis:: bacteremia from chemotherapy port: E coli How have you been since you were released from the hospital?: Better Any questions or concerns?: No  Items Reviewed: Did you receive and understand the discharge instructions provided?: Yes (reviewed with patient who verbalizes good understanding of same) Medications obtained and verified?: Yes (Medications Reviewed) (declines full medication review, as he is currently at specialist provider office visit/ getting IV antibiotic therapy; denies questions/ concerns around medications; self-manages meds; confirms obtained/ is taking all post-hospital discharge meds) Any new allergies since your discharge?: No Dietary orders reviewed?: No Do you have support at home?: Yes People in Home: spouse Name of Support/Comfort Primary Source: patient reports he is independent in self-care activities; family members/ spouse assists withcare needs as-if indicated/ needed  Home Care and Equipment/Supplies: [place] Ordered?: No Any new equipment or medical supplies ordered?: No  Functional Questionnaire: Do you need assistance with bathing/showering or dressing?: No Do you need assistance with meal preparation?: No Do you need assistance with eating?: No Do you have difficulty maintaining continence: No Do you need assistance with getting out of bed/getting out of a chair/moving?: No Do you have difficulty managing or taking your medications?: No  Folllow up  appointments reviewed: PCP Follow-up appointment confirmed?: Yes Date of PCP follow-up appointment?: 08/14/22 Follow-up Provider: PCP Dr. Tobie Poet Specialist [facility] Follow-up appointment confirmed?: Yes Date of Specialist follow-up appointment?: 07/09/22 Follow-Up Specialty Provider:: verified had/ has specialist appointments on 07/09/22; 07/10/22; and 07/16/22 Do you need transportation to your follow-up appointment?: No Do you understand care options if your condition(s) worsen?: Yes-patient verbalized understanding  SDOH Interventions Today    Flowsheet Row Most Recent Value  SDOH Interventions   Food Insecurity Interventions Intervention Not Indicated  Transportation Interventions Intervention Not Indicated  [drives self,  family assists as/ if indicated]      TOC Interventions Today    Flowsheet Row Most Recent Value  TOC Interventions   TOC Interventions Discussed/Reviewed TOC Interventions Discussed      Interventions Today    Flowsheet Row Most Recent Value  Chronic Disease   Chronic disease during today's visit Other  [bacteremia from CL port/ cancer]  General Interventions   General Interventions Discussed/Reviewed General Interventions Discussed, Doctor Visits  Doctor Visits Discussed/Reviewed Doctor Visits Discussed, Specialist, PCP  PCP/Specialist Visits Compliance with follow-up visit  Nutrition Interventions   Nutrition Discussed/Reviewed Nutrition Discussed  Pharmacy Interventions   Pharmacy Dicussed/Reviewed Pharmacy Topics Discussed      Oneta Rack, RN, BSN, CCRN Alumnus RN CM Care Coordination/ Transition of [place] Management [phone]: direct office

## 2022-07-15 ENCOUNTER — Other Ambulatory Visit: Payer: Self-pay

## 2022-07-15 MED ORDER — LORAZEPAM 1 MG PO TABS: ORAL_TABLET | ORAL | 1 refills | Status: DC

## 2022-07-16 DIAGNOSIS — C259 Malignant neoplasm of pancreas, unspecified: Secondary | ICD-10-CM | POA: Diagnosis not present

## 2022-07-16 DIAGNOSIS — E871 Hypo-osmolality and hyponatremia: Secondary | ICD-10-CM | POA: Diagnosis not present

## 2022-07-16 DIAGNOSIS — G893 Neoplasm related pain (acute) (chronic): Secondary | ICD-10-CM | POA: Diagnosis not present

## 2022-07-16 DIAGNOSIS — R7881 Bacteremia: Secondary | ICD-10-CM | POA: Diagnosis not present

## 2022-07-16 DIAGNOSIS — G620 Drug-induced polyneuropathy: Secondary | ICD-10-CM | POA: Diagnosis not present

## 2022-07-16 DIAGNOSIS — C787 Secondary malignant neoplasm of liver and intrahepatic bile duct: Secondary | ICD-10-CM | POA: Diagnosis not present

## 2022-07-16 DIAGNOSIS — Z8619 Personal history of other infectious and parasitic diseases: Secondary | ICD-10-CM | POA: Diagnosis not present

## 2022-07-24 DIAGNOSIS — R52 Pain, unspecified: Secondary | ICD-10-CM | POA: Diagnosis not present

## 2022-08-06 DIAGNOSIS — C787 Secondary malignant neoplasm of liver and intrahepatic bile duct: Secondary | ICD-10-CM | POA: Diagnosis not present

## 2022-08-06 DIAGNOSIS — G893 Neoplasm related pain (acute) (chronic): Secondary | ICD-10-CM | POA: Diagnosis not present

## 2022-08-06 DIAGNOSIS — R112 Nausea with vomiting, unspecified: Secondary | ICD-10-CM | POA: Diagnosis not present

## 2022-08-06 DIAGNOSIS — N3000 Acute cystitis without hematuria: Secondary | ICD-10-CM | POA: Diagnosis not present

## 2022-08-06 DIAGNOSIS — C259 Malignant neoplasm of pancreas, unspecified: Secondary | ICD-10-CM | POA: Diagnosis not present

## 2022-08-07 DIAGNOSIS — R5383 Other fatigue: Secondary | ICD-10-CM | POA: Diagnosis not present

## 2022-08-07 DIAGNOSIS — R52 Pain, unspecified: Secondary | ICD-10-CM | POA: Diagnosis not present

## 2022-08-07 DIAGNOSIS — Z8639 Personal history of other endocrine, nutritional and metabolic disease: Secondary | ICD-10-CM | POA: Diagnosis not present

## 2022-08-07 DIAGNOSIS — R439 Unspecified disturbances of smell and taste: Secondary | ICD-10-CM | POA: Diagnosis not present

## 2022-08-07 DIAGNOSIS — K5900 Constipation, unspecified: Secondary | ICD-10-CM | POA: Diagnosis not present

## 2022-08-07 DIAGNOSIS — R110 Nausea: Secondary | ICD-10-CM | POA: Diagnosis not present

## 2022-08-07 DIAGNOSIS — C259 Malignant neoplasm of pancreas, unspecified: Secondary | ICD-10-CM | POA: Diagnosis not present

## 2022-08-07 DIAGNOSIS — G4700 Insomnia, unspecified: Secondary | ICD-10-CM | POA: Diagnosis not present

## 2022-08-14 ENCOUNTER — Ambulatory Visit (INDEPENDENT_AMBULATORY_CARE_PROVIDER_SITE_OTHER): Payer: PPO | Admitting: Family Medicine

## 2022-08-14 ENCOUNTER — Encounter: Payer: Self-pay | Admitting: Family Medicine

## 2022-08-14 VITALS — BP 132/70 | HR 97 | Temp 97.0°F | Ht 71.0 in | Wt 156.0 lb

## 2022-08-14 DIAGNOSIS — N3001 Acute cystitis with hematuria: Secondary | ICD-10-CM | POA: Diagnosis not present

## 2022-08-14 DIAGNOSIS — N138 Other obstructive and reflux uropathy: Secondary | ICD-10-CM | POA: Diagnosis not present

## 2022-08-14 DIAGNOSIS — C787 Secondary malignant neoplasm of liver and intrahepatic bile duct: Secondary | ICD-10-CM | POA: Diagnosis not present

## 2022-08-14 DIAGNOSIS — E43 Unspecified severe protein-calorie malnutrition: Secondary | ICD-10-CM

## 2022-08-14 DIAGNOSIS — C258 Malignant neoplasm of overlapping sites of pancreas: Secondary | ICD-10-CM

## 2022-08-14 DIAGNOSIS — C259 Malignant neoplasm of pancreas, unspecified: Secondary | ICD-10-CM | POA: Insufficient documentation

## 2022-08-14 DIAGNOSIS — K5904 Chronic idiopathic constipation: Secondary | ICD-10-CM | POA: Diagnosis not present

## 2022-08-14 DIAGNOSIS — N401 Enlarged prostate with lower urinary tract symptoms: Secondary | ICD-10-CM | POA: Diagnosis not present

## 2022-08-14 DIAGNOSIS — I10 Essential (primary) hypertension: Secondary | ICD-10-CM

## 2022-08-14 DIAGNOSIS — H6992 Unspecified Eustachian tube disorder, left ear: Secondary | ICD-10-CM | POA: Diagnosis not present

## 2022-08-14 DIAGNOSIS — K219 Gastro-esophageal reflux disease without esophagitis: Secondary | ICD-10-CM | POA: Diagnosis not present

## 2022-08-14 LAB — POCT URINALYSIS DIP (CLINITEK)
Glucose, UA: NEGATIVE mg/dL
Ketones, POC UA: NEGATIVE mg/dL
Nitrite, UA: POSITIVE — AB
Spec Grav, UA: 1.015 (ref 1.010–1.025)
Urobilinogen, UA: 0.2 E.U./dL
pH, UA: 6 (ref 5.0–8.0)

## 2022-08-14 MED ORDER — PROCHLORPERAZINE MALEATE 10 MG PO TABS: 10.0000 mg | ORAL_TABLET | Freq: Four times a day (QID) | ORAL | 1 refills | Status: DC | PRN

## 2022-08-14 MED ORDER — CIPROFLOXACIN HCL 250 MG PO TABS: 250.0000 mg | ORAL_TABLET | Freq: Two times a day (BID) | ORAL | 0 refills | Status: DC

## 2022-08-14 MED ORDER — FAMOTIDINE 20 MG PO TABS: 20.0000 mg | ORAL_TABLET | Freq: Every day | ORAL | 0 refills | Status: DC | PRN

## 2022-08-14 NOTE — Assessment & Plan Note (Signed)
Resolved with TURP

## 2022-08-14 NOTE — Assessment & Plan Note (Signed)
Cipro 250 mg twice daily x 5 days for bladder infection. Return in 10 days for recheck of urine.

## 2022-08-14 NOTE — Assessment & Plan Note (Signed)
Metastatic.  Management per specialist.  Oncology at [facility].

## 2022-08-14 NOTE — Assessment & Plan Note (Signed)
Flonase for eustachian tube dysfunction

## 2022-08-14 NOTE — Progress Notes (Signed)
Subjective:  [name], male    DOB: 10/15/1965  Age: 57 y.o. MRN: [id number]  Chief Complaint  Patient presents with   Gastroesophageal Reflux   Hyperlipidemia   Hypertension    HPI: Hypertension: Taking Amlodipine 10 mg daily, Lisinopril 20 mg daily. Hyperlipidemia: Takes Omega-3-sometimes GERD: Currently on Esomeprazole 40 mg daily. Reflux breaks thorugh.  BPH: Off tamsulosin. TURP 05/27/2022. This has improved. Over the last couple of days he is having pressure.   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 Patient had liver ablation of most recent metastatic lesion 08/01/2021. Has stopped chemo treatments. Sees oncology every 3 months.   Denies depression.     08/23/2021   11:14 AM 01/28/2022    8:47 AM 08/14/2022    9:33 AM  Fall Risk  Falls in the past year? 0 0 0  Was there an injury with Fall? 0 0 0  Fall Risk Category Calculator 0 0 0  Fall Risk Category (Retired) Low Low   (RETIRED) Patient Fall Risk Level  Low fall risk   Patient at Risk for Falls Due to  No Fall Risks Impaired balance/gait  Fall risk Follow up  Falls evaluation completed Falls evaluation completed      Review of Systems  Constitutional:  Positive for fever (100.2 last night). Negative for chills, diaphoresis and fatigue.  HENT:  Positive for ear pain (left ear pressure.). Negative for congestion and sore throat.   Respiratory:  Negative for cough and shortness of breath.   Cardiovascular:  Negative for chest pain and leg swelling.  Gastrointestinal:  Positive for abdominal pain and constipation. Negative for diarrhea, nausea and vomiting.  Genitourinary:  Negative for dysuria and urgency.       Abdominal pressure, dark, "murky" urine  Musculoskeletal:  Negative for arthralgias and myalgias.  Neurological:  Negative for dizziness and headaches.  Psychiatric/Behavioral:  Negative for dysphoric mood.     Current Outpatient Medications on File Prior to Visit  Medication  Sig Dispense Refill   acetaminophen (TYLENOL) 500 MG tablet Take 500 mg by mouth every 6 (six) hours as needed.     amLODipine (NORVASC) 10 MG tablet Take 1 tablet (10 mg total) by mouth daily. 90 tablet 1   CREON 36000-114000 units CPEP capsule TAKE 3 capsules with meals. 1 after starting/2 when finished eating. Take 2 capsules with snack after starting to eat.     cyclobenzaprine (FLEXERIL) 10 MG tablet Take 10 mg by mouth 3 (three) times daily as needed for muscle spasms.     dicyclomine (BENTYL) 10 MG capsule Take 10 mg by mouth 3 (three) times daily as needed.     esomeprazole (NEXIUM) 40 MG capsule Take 1 capsule (40 mg total) by mouth daily. 90 capsule 3   gabapentin (NEURONTIN) 300 MG capsule Take 1 capsule (300 mg total) by mouth 2 (two) times daily. 180 capsule 1   guaifenesin (HUMIBID E) 400 MG TABS tablet Take by mouth as needed.     HYDROcodone-acetaminophen (NORCO/VICODIN) 5-325 MG tablet Take 1 tablet by mouth every 6 (six) hours as needed for moderate pain.     hydrOXYzine (VISTARIL) 25 MG capsule Take 1 capsule (25 mg total) by mouth 2 (two) times daily as needed for nausea or vomiting. 1 capsule 0   linaclotide (LINZESS) 72 MCG capsule Take 1 capsule (72 mcg total) by mouth daily before breakfast. 90 capsule 3   lisinopril (ZESTRIL) 20 MG tablet Take 1 tablet (20 mg total)  by mouth daily. 90 tablet 3   LORazepam (ATIVAN) 1 MG tablet Take 1 tablet by mouth 3 times daily as needed for anxiety or sedation. 90 tablet 1   meloxicam (MOBIC) 7.5 MG tablet Take 1 tablet (7.5 mg total) by mouth 2 (two) times daily. 180 tablet 3   Multiple Vitamin (MULTIVITAMIN) capsule Take 1 capsule by mouth daily.     Omega-3 1000 MG CAPS Take by mouth.     SODIUM CHLORIDE PO Take 1,000 mg by mouth daily.     Bioflavonoid Products (QUERCETIN COMPLEX IMMUNE) CAPS Take 1 tablet by mouth daily. 1 capsule 0   No current facility-administered medications on file prior to visit.   Past Medical History:   Diagnosis Date   Barrett's esophagus    Cervicalgia    Essential hypertension    GERD (gastroesophageal reflux disease)    History of COVID-19    05/2019   Immunocompromised state due to drug therapy 05/18/2022   Mixed hyperlipidemia    Pancreatic carcinoma metastatic to liver    Primary malignant neoplasm of head of pancreas    Past Surgical History:  Procedure Laterality Date   LAPAROSCOPIC CHOLECYSTECTOMY  01/05/2015   NASAL POLYP SURGERY     REFRACTIVE SURGERY Bilateral 1988   WHIPPLE PROCEDURE  11/09/2015   Pylorus sparring    Family History  Problem Relation Age of Onset   Cancer Mother        breast   Hypertension Father    Hyperlipidemia Father    Cancer Father        prostate   Hypertension Sister    Social History   Socioeconomic History   Marital status: Married    Spouse name: Not on file   Number of children: 2   Years of education: Not on file   Highest education level: Not on file  Occupational History   Occupation: Retired    Comment: [employer]  Tobacco Use   Smoking status: Former    Types: Cigars    Quit date: 2005    Years since quitting: 19.2   Smokeless tobacco: Never  Substance and Sexual Activity   Alcohol use: Not Currently   Drug use: Never   Sexual activity: Yes    Partners: Female  Other Topics Concern   Not on file  Social History Narrative   Not on file   Social Determinants of Health   Financial Resource Strain: Not on file  Food Insecurity: No Food Insecurity (07/10/2022)   Hunger Vital Sign    Worried About Running Out of Food in the Last Year: Never true    Ran Out of Food in the Last Year: Never true  Transportation Needs: No Transportation Needs (07/10/2022)   PRAPARE - Administrator, Civil ServiceTransportation    Lack of Transportation (Medical): No    Lack of Transportation (Non-Medical): No  Physical Activity: Not on file  Stress: Not on file  Social Connections: Not on file    Objective:  BP 132/70   Pulse 97   Temp (!) 97 F  (36.1 C)   Ht 5\' 11"  (1.803 m)   Wt 156 lb (70.8 kg)   SpO2 99%   BMI 21.76 kg/m      08/14/2022    9:30 AM 06/27/2022    8:56 AM 05/12/2022    8:44 AM  BP/Weight  Systolic BP 132  782106  Diastolic BP 70  60  Wt. (Lbs) 156 170 170  BMI 21.76 kg/m2 23.71 kg/m2 23.71 kg/m2  Physical Exam Vitals reviewed.  Constitutional:      Appearance: Normal appearance.  Cardiovascular:     Rate and Rhythm: Normal rate and regular rhythm.     Heart sounds: Normal heart sounds.  Pulmonary:     Effort: Pulmonary effort is normal.     Breath sounds: Normal breath sounds. No wheezing, rhonchi or rales.  Abdominal:     General: Bowel sounds are normal.     Palpations: Abdomen is soft.     Tenderness: There is abdominal tenderness (lower abdomen).  Neurological:     Mental Status: He is alert and oriented to person, place, and time.  Psychiatric:        Mood and Affect: Mood normal.        Behavior: Behavior normal.    Diabetic Foot Exam - Simple   No data filed      Lab Results  Component Value Date   WBC 3.6 08/26/2021   HGB 14.6 08/26/2021   HCT 42.5 08/26/2021   PLT 196 08/26/2021   GLUCOSE 96 08/26/2021   CHOL 171 08/26/2021   TRIG 102 08/26/2021   HDL 56 08/26/2021   LDLCALC 97 08/26/2021   ALT 23 08/26/2021   AST 24 08/26/2021   NA 137 08/26/2021   K 4.6 08/26/2021   CL 99 08/26/2021   CREATININE 0.92 08/26/2021   BUN 11 08/26/2021   CO2 24 08/26/2021      Assessment & Plan:    Essential hypertension, benign Assessment & Plan: The current medical regimen is effective;  continue present plan and medications. Continue amlodipine 10 mg daily and lisinopril 20 mg daily.  Monitor bp. He may require less medicine as his weight decreases.   GERD without esophagitis Assessment & Plan: Not controlled. Continue nexium 40 mg daily.  Add pepcid 20 mg daily as needed.    Pancreatic cancer metastasized to liver Assessment & Plan: Chemo discontinued.  We discussed  pain management, nausea management.  Sent refill of compazine.  Oncology filling pain medicines.    BPH with obstruction/lower urinary tract symptoms Assessment & Plan: Resolved with TURP   Acute cystitis with hematuria Assessment & Plan: Cipro 250 mg twice daily x 5 days for bladder infection. Return in 10 days for recheck of urine.   Orders: -     Urine Culture -     POCT URINALYSIS DIP (CLINITEK)  Dysfunction of left eustachian tube Assessment & Plan: Flonase for eustachian tube dysfunction   Chronic idiopathic constipation  Severe protein-calorie malnutrition Lily Kocher: less than 60% of standard weight) Assessment & Plan: Continue to try to eat high protein foods.    Overlapping malignant neoplasm of pancreas Assessment & Plan: Metastatic.  Management per specialist.  Oncology at [facility].    Other orders -     Prochlorperazine Maleate; Take 1 tablet (10 mg total) by mouth every 6 (six) hours as needed for nausea or vomiting.  Dispense: 120 tablet; Refill: 1 -     Famotidine; Take 1 tablet (20 mg total) by mouth daily as needed for heartburn or indigestion.  Dispense: 90 tablet; Refill: 0 -     Ciprofloxacin HCl; Take 1 tablet (250 mg total) by mouth 2 (two) times daily for 5 days.  Dispense: 10 tablet; Refill: 0    Follow-up: Return in about 3 months (around 11/13/2022) for chronic follow up, Nurse visit for UA in 10 days.Annia Friendly A Bramblett,acting as a scribe for Blane Ohara, MD.,have documented all relevant documentation  on the behalf of Blane Ohara, MD,as directed by  Blane Ohara, MD while in the presence of Blane Ohara, MD.   An After Visit Summary was printed and given to the patient.  I attest that I have reviewed this visit and agree with the plan scribed by my staff.  Total time spent on today's visit was greater than 30 minutes, including both face-to-face time and nonface-to-face time personally spent on review of chart (labs and imaging),  discussing labs and goals, discussing further work-up, treatment options, referrals to specialist if needed, reviewing outside records of pertinent, answering patient's questions, and coordinating care.   Blane Ohara, MD [facility] [phone]

## 2022-08-14 NOTE — Patient Instructions (Addendum)
Add pepcid as needed.   May take linzess 72 mg 2 daily as needed.   Cipro 250 mg twice daily x 5 days for bladder infection. Return in 10 days for recheck of urine.   Flonase for eustachian tube dysfunction

## 2022-08-14 NOTE — Assessment & Plan Note (Signed)
The current medical regimen is effective;  continue present plan and medications. Continue amlodipine 10 mg daily and lisinopril 20 mg daily.  Monitor bp. He may require less medicine as his weight decreases.

## 2022-08-14 NOTE — Assessment & Plan Note (Signed)
Not controlled. Continue nexium 40 mg daily.  Add pepcid 20 mg daily as needed.

## 2022-08-14 NOTE — Assessment & Plan Note (Signed)
Continue to try to eat high protein foods.

## 2022-08-14 NOTE — Assessment & Plan Note (Signed)
Chemo discontinued.  We discussed pain management, nausea management.  Sent refill of compazine.  Oncology filling pain medicines.

## 2022-08-18 LAB — URINE CULTURE

## 2022-08-19 ENCOUNTER — Other Ambulatory Visit: Payer: Self-pay | Admitting: Family Medicine

## 2022-08-19 ENCOUNTER — Other Ambulatory Visit: Payer: Self-pay

## 2022-08-19 MED ORDER — DOXYCYCLINE HYCLATE 100 MG PO TABS
100.0000 mg | ORAL_TABLET | Freq: Two times a day (BID) | ORAL | 0 refills | Status: DC
Start: 2022-08-19 — End: 2022-09-02

## 2022-08-19 NOTE — Progress Notes (Signed)
Patient informed.  He will stop cipro and begin doxycycline.

## 2022-08-19 NOTE — Telephone Encounter (Signed)
Daniel Neal was notified of urine culture results.  He will stop cipro and begin doxycycline and recheck in 2 weeks.  Follow-up appointment scheduled.

## 2022-08-22 ENCOUNTER — Ambulatory Visit: Payer: PPO | Admitting: Family Medicine

## 2022-08-22 DIAGNOSIS — R17 Unspecified jaundice: Secondary | ICD-10-CM | POA: Diagnosis not present

## 2022-08-22 DIAGNOSIS — R531 Weakness: Secondary | ICD-10-CM | POA: Diagnosis not present

## 2022-08-22 DIAGNOSIS — Z5329 Procedure and treatment not carried out because of patient's decision for other reasons: Secondary | ICD-10-CM | POA: Diagnosis not present

## 2022-08-22 DIAGNOSIS — R609 Edema, unspecified: Secondary | ICD-10-CM | POA: Diagnosis not present

## 2022-08-22 DIAGNOSIS — R9431 Abnormal electrocardiogram [ECG] [EKG]: Secondary | ICD-10-CM | POA: Diagnosis not present

## 2022-08-22 DIAGNOSIS — C787 Secondary malignant neoplasm of liver and intrahepatic bile duct: Secondary | ICD-10-CM | POA: Diagnosis not present

## 2022-08-22 DIAGNOSIS — R4182 Altered mental status, unspecified: Secondary | ICD-10-CM | POA: Diagnosis not present

## 2022-08-22 DIAGNOSIS — A419 Sepsis, unspecified organism: Secondary | ICD-10-CM | POA: Diagnosis not present

## 2022-08-22 DIAGNOSIS — R000 Tachycardia, unspecified: Secondary | ICD-10-CM | POA: Diagnosis not present

## 2022-08-22 DIAGNOSIS — Z515 Encounter for palliative care: Secondary | ICD-10-CM | POA: Diagnosis not present

## 2022-08-22 DIAGNOSIS — R9082 White matter disease, unspecified: Secondary | ICD-10-CM | POA: Diagnosis not present

## 2022-08-22 DIAGNOSIS — Z8505 Personal history of malignant neoplasm of liver: Secondary | ICD-10-CM | POA: Diagnosis not present

## 2022-08-25 ENCOUNTER — Ambulatory Visit: Payer: PPO

## 2022-09-02 ENCOUNTER — Ambulatory Visit: Payer: PPO

## 2022-09-03 DEATH — deceased

## 2022-10-13 IMAGING — DX DG FOOT COMPLETE 3+V*L*
3 series · 3 of 3 positions shown · non-contrast
Comparison: None Available.

CLINICAL DATA: Left foot lesion. Knot on bottom of foot for a few
days. No injury.

EXAM:
LEFT FOOT - COMPLETE 3+ VIEW

[foot ap wb]
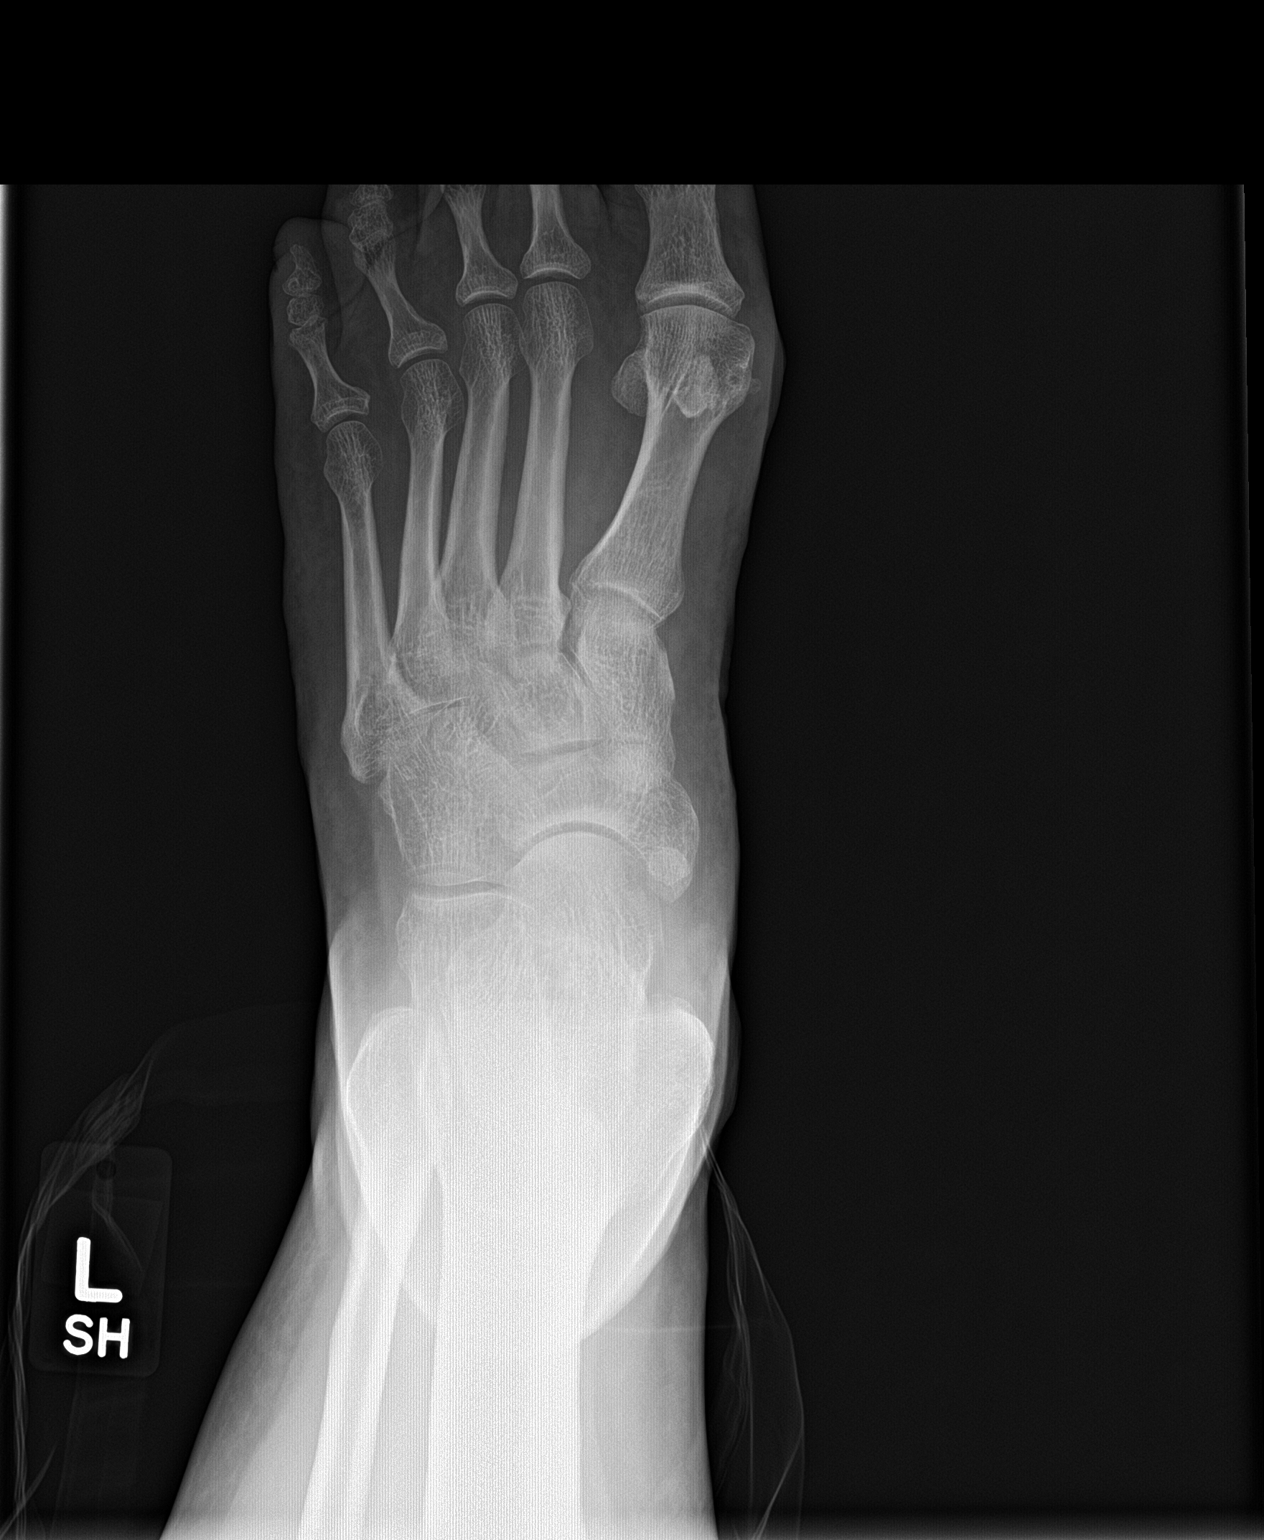

[foot obl wb]
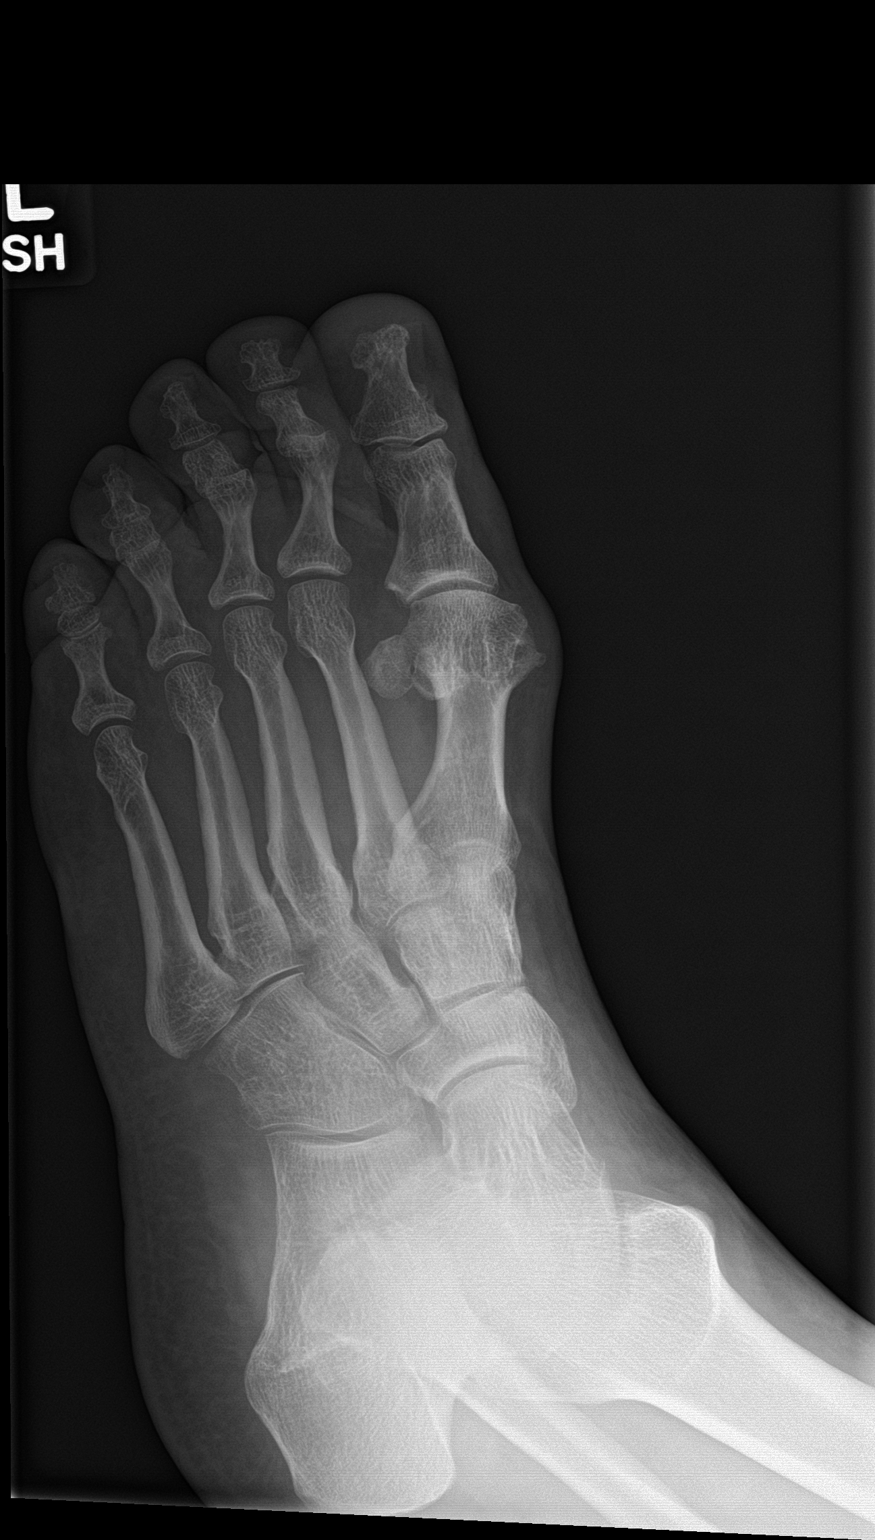

[foot lat wb]
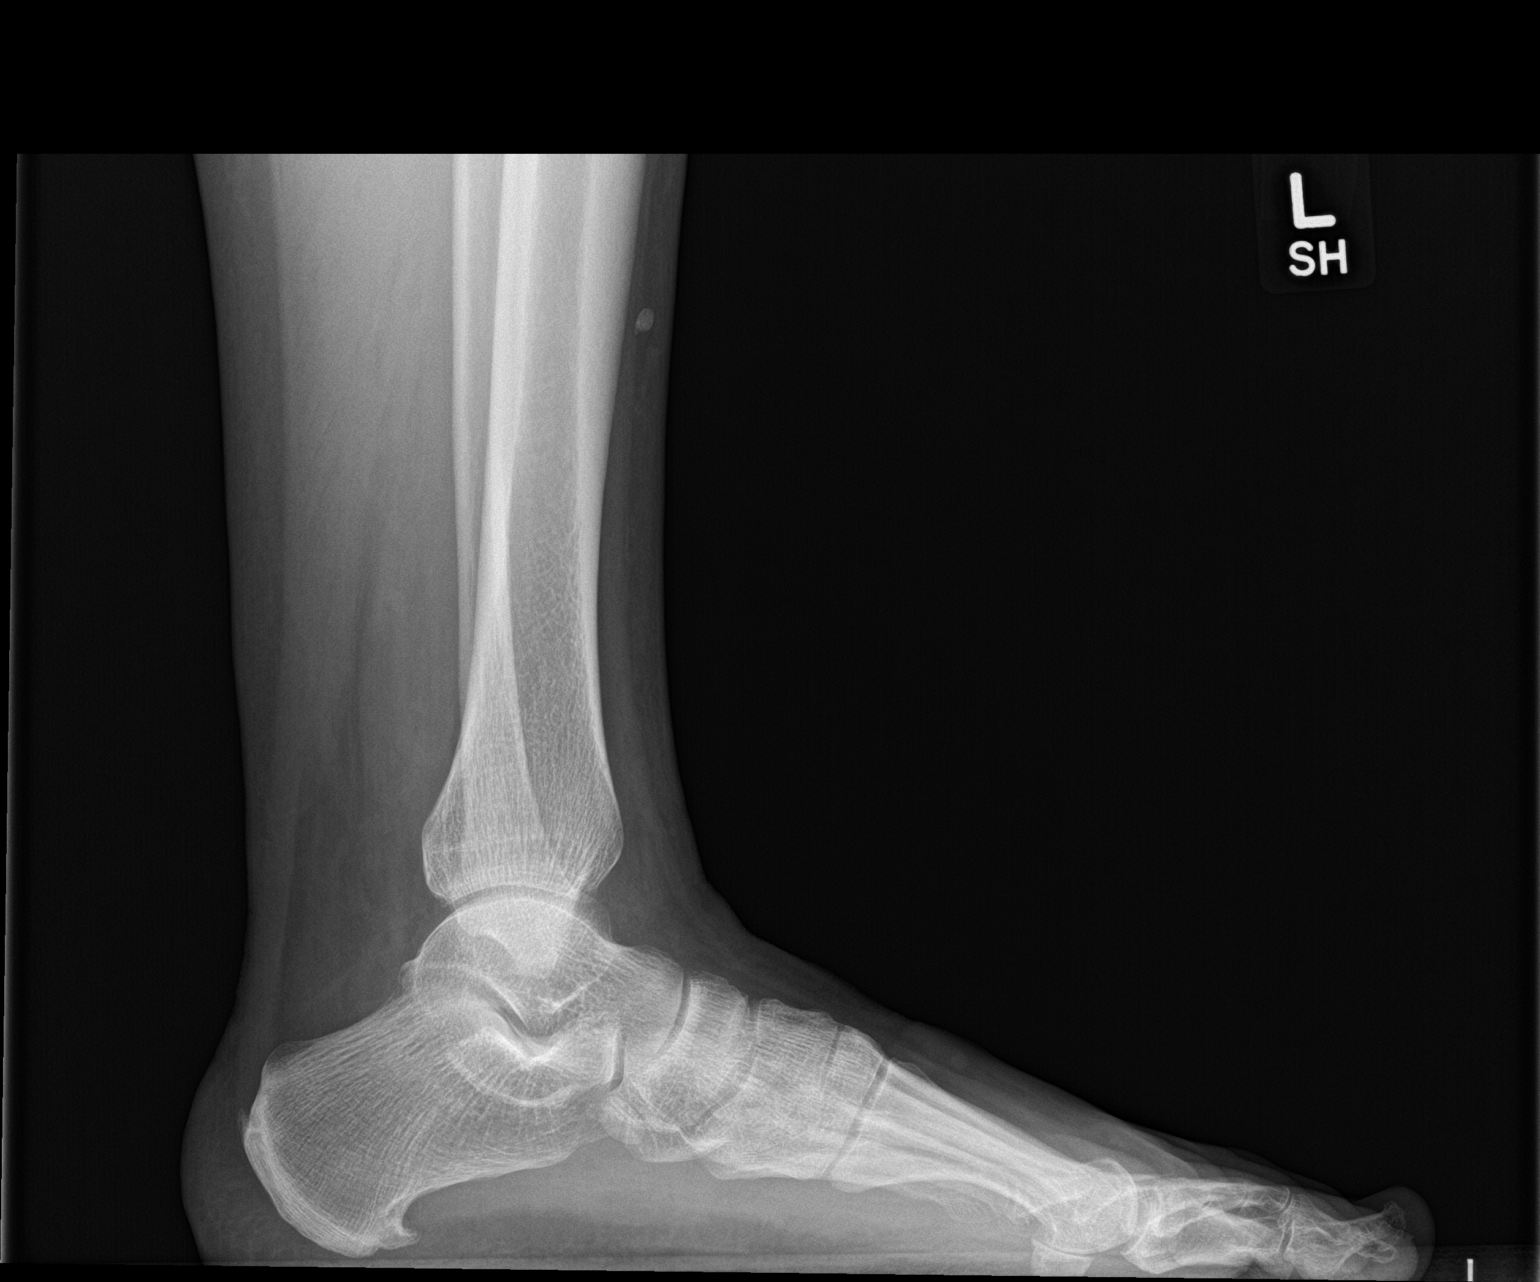

[3 of 3 positions shown; findings below may reference images not displayed]

FINDINGS: No fractures or dislocations. Minimal enthesopathic changes at the
posterior calcaneus at the Achilles insertion site. Small plantar
spur. No other bony or soft tissue abnormalities are identified. No
cause for the not/bump on the bottom of the foot. No foreign body.
IMPRESSION: No cause for the patient's symptoms identified.

## 2022-11-17 ENCOUNTER — Ambulatory Visit: Payer: PPO | Admitting: Family Medicine
